# Patient Record
Sex: Female | Born: 1960 | Race: Black or African American | Hispanic: No | Marital: Married | Smoking: Never smoker
Health system: Southern US, Community
[De-identification: ages and names within clinical notes are randomized; demographics above are authoritative.]

## PROBLEM LIST (undated history)

## (undated) DIAGNOSIS — Z803 Family history of malignant neoplasm of breast: Secondary | ICD-10-CM

## (undated) DIAGNOSIS — G4730 Sleep apnea, unspecified: Secondary | ICD-10-CM

## (undated) DIAGNOSIS — I10 Essential (primary) hypertension: Secondary | ICD-10-CM

## (undated) HISTORY — PX: OTHER SURGICAL HISTORY: SHX169

## (undated) HISTORY — PX: MYOMECTOMY: SHX85

## (undated) HISTORY — DX: Family history of malignant neoplasm of breast: Z80.3

## (undated) HISTORY — PX: WISDOM TOOTH EXTRACTION: SHX21

## (undated) HISTORY — PX: CHOLECYSTECTOMY: SHX55

---

## 2018-08-03 ENCOUNTER — Emergency Department
Admission: EM | Admit: 2018-08-03 | Discharge: 2018-08-03 | Disposition: A | Discharge status: Home / Self Care | Payer: BLUE CROSS/BLUE SHIELD | Attending: Emergency Medicine | Admitting: Emergency Medicine

## 2018-08-03 ENCOUNTER — Emergency Department: Payer: BLUE CROSS/BLUE SHIELD

## 2018-08-03 ENCOUNTER — Other Ambulatory Visit: Payer: Self-pay

## 2018-08-03 DIAGNOSIS — R002 Palpitations: Secondary | ICD-10-CM | POA: Diagnosis present

## 2018-08-03 DIAGNOSIS — I493 Ventricular premature depolarization: Secondary | ICD-10-CM | POA: Insufficient documentation

## 2018-08-03 LAB — CBC
HEMATOCRIT: 39 % (ref 35.0–47.0)
HEMOGLOBIN: 13.6 g/dL (ref 12.0–16.0)
MCH: 29.9 pg (ref 26.0–34.0)
MCHC: 35 g/dL (ref 32.0–36.0)
MCV: 85.3 fL (ref 80.0–100.0)
Platelets: 262 10*3/uL (ref 150–440)
RBC: 4.57 MIL/uL (ref 3.80–5.20)
RDW: 14.9 % — ABNORMAL HIGH (ref 11.5–14.5)
WBC: 8.8 10*3/uL (ref 3.6–11.0)

## 2018-08-03 LAB — COMPREHENSIVE METABOLIC PANEL
ALK PHOS: 57 U/L (ref 38–126)
ALT: 16 U/L (ref 0–44)
ANION GAP: 11 (ref 5–15)
AST: 21 U/L (ref 15–41)
Albumin: 4.4 g/dL (ref 3.5–5.0)
BILIRUBIN TOTAL: 0.6 mg/dL (ref 0.3–1.2)
BUN: 15 mg/dL (ref 6–20)
CO2: 23 mmol/L (ref 22–32)
CREATININE: 0.99 mg/dL (ref 0.44–1.00)
Calcium: 9.3 mg/dL (ref 8.9–10.3)
Chloride: 104 mmol/L (ref 98–111)
GFR calc Af Amer: >60 mL/min (ref >60)
GFR calc non Af Amer: >60 mL/min (ref >60)
GLUCOSE: 107 mg/dL — AB (ref 70–99)
Potassium: 3.7 mmol/L (ref 3.5–5.1)
Sodium: 138 mmol/L (ref 135–145)
Total Protein: 8.1 g/dL (ref 6.5–8.1)

## 2018-08-03 LAB — MAGNESIUM: MAGNESIUM: 2.2 mg/dL (ref 1.7–2.4)

## 2018-08-03 LAB — TROPONIN I: Troponin I: <0.03 ng/mL (ref <0.03)

## 2018-08-03 NOTE — ED Triage Notes (Signed)
Pt in with co palpitations that woke her up tonight, no hx of the same.

## 2018-08-03 NOTE — ED Provider Notes (Signed)
[facility] Emergency Department Provider Note    First MD Initiated Contact with Patient 08/03/18 [phone]     (approximate)  I have reviewed the triage vital signs and the nursing notes.   HISTORY  Chief Complaint Palpitations    HPI Shelly Kaufman is a 57 y.o. female since emergency with acute onset of rapid heartbeat on awakening this morning. Patient denies any chest pain or shortness of breath no nausea or vomiting.   Past medical history hypertension There are no active problems to display for this patient.     Prior to Admission medications   Not on File    Allergies no known drug allergies No family history on file.  Social History Social History   Tobacco Use  . Smoking status: Not on file  Substance Use Topics  . Alcohol use: Not on file  . Drug use: Not on file    Review of Systems Constitutional: No fever/chills Eyes: No visual changes. ENT: No sore throat. Cardiovascular: Denies chest pain. positive for palpitations Respiratory: Denies shortness of breath. Gastrointestinal: No abdominal pain.  No nausea, no vomiting.  No diarrhea.  No constipation. Genitourinary: Negative for dysuria. Musculoskeletal: Negative for neck pain.  Negative for back pain. Integumentary: Negative for rash. Neurological: Negative for headaches, focal weakness or numbness.   ____________________________________________   PHYSICAL EXAM:  VITAL SIGNS: ED Triage Vitals  Enc Vitals Group     BP 08/03/18 0110 (!) 159/78     Pulse Rate 08/03/18 0110 78     Resp 08/03/18 0110 18     Temp 08/03/18 0110 98.3 F (36.8 C)     Temp Source 08/03/18 0110 Oral     SpO2 08/03/18 0110 100 %     Weight 08/03/18 0107 (!) 137.9 kg (304 lb)     Height 08/03/18 0107 1.727 m (5\' 8" )     Head Circumference --      Peak Flow --      Pain Score 08/03/18 0107 0     Pain Loc --      Pain Edu? --      Excl. in GC? --     Constitutional: Alert and  oriented. Well appearing and in no acute distress. Eyes: Conjunctivae are normal.  Mouth/Throat: Mucous membranes are moist.  Oropharynx non-erythematous. Neck: No stridor.   Cardiovascular: Normal rate, regular rhythm. Good peripheral circulation. Grossly normal heart sounds. Respiratory: Normal respiratory effort.  No retractions. Lungs CTAB. Gastrointestinal: Soft and nontender. No distention.  Musculoskeletal: No lower extremity tenderness nor edema. No gross deformities of extremities. Neurologic:  Normal speech and language. No gross focal neurologic deficits are appreciated.  Skin:  Skin is warm, dry and intact. No rash noted. Psychiatric: Mood and affect are normal. Speech and behavior are normal.  ____________________________________________   LABS (all labs ordered are listed, but only abnormal results are displayed)  Labs Reviewed  CBC - Abnormal; Notable for the following components:      Result Value   RDW 14.9 (*)    All other components within normal limits  COMPREHENSIVE METABOLIC PANEL - Abnormal; Notable for the following components:   Glucose, Bld 107 (*)    All other components within normal limits  TROPONIN I  MAGNESIUM   ____________________________________________  EKG  ED ECG REPORT I, [place] N BROWN, the attending physician, personally viewed and interpreted this ECG.   Date: 08/03/2018  EKG Time: 1:10AM  Rate: 88  Rhythm: Normal sinus rhythm  Axis: normal  Intervals:normal  ST&T Change: None  ____________________________________________  RADIOLOGY I, [place] N BROWN, personally viewed and evaluated these images (plain radiographs) as part of my medical decision making, as well as reviewing the written report by the radiologist.  ED MD interpretation:  normal chest x-ray per radiologist.  Official radiology report(s): Dg Chest 2 View  Result Date: 08/03/2018 CLINICAL DATA:  Palpitations EXAM: CHEST - 2 VIEW COMPARISON:  None. FINDINGS:  Lungs are clear.  No pleural effusion or pneumothorax. The heart is normal in size. Visualized osseous structures are within normal limits. IMPRESSION: Normal chest radiographs. Electronically Signed   By: Charline Bills M.D.   On: 08/03/2018 01:48    Procedures   ____________________________________________   INITIAL IMPRESSION / ASSESSMENT AND PLAN / ED COURSE  As part of my medical decision making, I reviewed the following data within the electronic MEDICAL RECORD NUMBER   57 year old female presenting with above stated history of physical exam secondary to palpitations that awoke the patient this morning. While evaluating the patient patient had isolated premature ventricular contractions noted on the cardiac monitor. When I informed the patient of this she states that this is what was occurring at home. Laboratory data unremarkable chest x-ray likewise negative. Patient be referred to Dr. Juliann Pares for further outpatient evaluation ____________________________________________  FINAL CLINICAL IMPRESSION(S) / ED DIAGNOSES  Final diagnoses:  PVC (premature ventricular contraction)     MEDICATIONS GIVEN DURING THIS VISIT:  Medications - No data to display   ED Discharge Orders    None       Note:  This document was prepared using Dragon voice recognition software and may include unintentional dictation errors.    Darci Current, MD 08/03/18 [phone]

## 2018-11-15 DIAGNOSIS — I10 Essential (primary) hypertension: Secondary | ICD-10-CM | POA: Insufficient documentation

## 2018-11-15 DIAGNOSIS — Z8679 Personal history of other diseases of the circulatory system: Secondary | ICD-10-CM | POA: Insufficient documentation

## 2018-11-15 DIAGNOSIS — Z780 Asymptomatic menopausal state: Secondary | ICD-10-CM | POA: Insufficient documentation

## 2018-11-30 ENCOUNTER — Other Ambulatory Visit: Payer: Self-pay | Admitting: Internal Medicine

## 2018-11-30 ENCOUNTER — Other Ambulatory Visit (HOSPITAL_COMMUNITY): Payer: Self-pay | Admitting: Internal Medicine

## 2018-11-30 DIAGNOSIS — R3121 Asymptomatic microscopic hematuria: Secondary | ICD-10-CM

## 2018-12-07 ENCOUNTER — Ambulatory Visit
Admission: RE | Admit: 2018-12-07 | Discharge: 2018-12-07 | Disposition: A | Discharge status: Home / Self Care | Payer: PRIVATE HEALTH INSURANCE | Source: Ambulatory Visit | Attending: Internal Medicine | Admitting: Internal Medicine

## 2018-12-07 DIAGNOSIS — R3121 Asymptomatic microscopic hematuria: Secondary | ICD-10-CM | POA: Insufficient documentation

## 2018-12-07 HISTORY — DX: Essential (primary) hypertension: I10

## 2018-12-07 MED ORDER — IOPAMIDOL (ISOVUE-370) INJECTION 76%
125.0000 mL | Freq: Once | INTRAVENOUS | Status: AC | PRN
  Administered 2018-12-07: 125 mL via INTRAVENOUS

## 2018-12-28 ENCOUNTER — Ambulatory Visit: Payer: Self-pay | Admitting: Urology

## 2018-12-28 NOTE — Progress Notes (Incomplete)
12/28/2018 8:17 AM   Shelly Kaufman 07-30-1961 [id number]  Referring provider: Leotis Shames, MD [address] [facility] [place], [place] [address]  No chief complaint on file.   HPI: Shelly Kaufman is a 58 yo F who presents today for the evaluation and management of asymptomatic microscopic hematuria. She was referred to [place] by Leotis Shames, MD.   On her previous visit with her PCP, her UA w/micro showed >3 RBC and a negative urine culture.   PMH: Past Medical History:  Diagnosis Date   Hypertension     Surgical History: *** The histories are not reviewed yet. Please review them in the "History" navigator section and refresh this SmartLink.  Home Medications:  Allergies as of 12/28/2018   Not on File     Medication List    as of December 28, 2018  8:17 AM   You have not been prescribed any medications.     Allergies: Not on File  Family History: No family history on file.  Social History:  has no history on file for tobacco, alcohol, and drug.  ROS:                                        Physical Exam: There were no vitals taken for this visit.  Constitutional:  Alert and oriented, No acute distress. HEENT: [place] AT, moist mucus membranes.  Trachea midline, no masses. Cardiovascular: No clubbing, cyanosis, or edema. Respiratory: Normal respiratory effort, no increased work of breathing. GI: Abdomen is soft, nontender, nondistended, no abdominal masses GU: No CVA tenderness Lymph: No cervical or inguinal lymphadenopathy. Skin: No rashes, bruises or suspicious lesions. Neurologic: Grossly intact, no focal deficits, moving all 4 extremities. Psychiatric: Normal mood and affect.  Laboratory Data: Lab Results  Component Value Date   WBC 8.8 08/03/2018   HGB 13.6 08/03/2018   HCT 39.0 08/03/2018   MCV 85.3 08/03/2018   PLT 262 08/03/2018    Lab Results  Component Value Date   CREATININE 0.99 08/03/2018     No results found for: PSA  No results found for: TESTOSTERONE  No results found for: HGBA1C  Urinalysis No results found for: COLORURINE, APPEARANCEUR, LABSPEC, PHURINE, GLUCOSEU, HGBUR, BILIRUBINUR, KETONESUR, PROTEINUR, UROBILINOGEN, NITRITE, LEUKOCYTESUR  No results found for: LABMICR, WBCUA, RBCUA, LABEPIT, MUCUS, BACTERIA  Pertinent Imaging: CLINICAL DATA:  Asymptomatic microscopic hematuria  EXAM: CT ABDOMEN AND PELVIS WITHOUT AND WITH CONTRAST  TECHNIQUE: Multidetector CT imaging of the abdomen and pelvis was performed following the standard protocol before and following the bolus administration of intravenous contrast.  CONTRAST:  ISOVUE-370 IOPAMIDOL (ISOVUE-370) INJECTION 76%  COMPARISON:  None.  FINDINGS: Lower chest: Unremarkable  Hepatobiliary: Cholecystectomy.  Otherwise unremarkable.  Pancreas: Unremarkable  Spleen: Unremarkable  Adrenals/Urinary Tract: Both adrenal glands appear normal. Small left peripelvic renal cysts. No urinary tract calculi, abnormal renal parenchymal enhancement, or abnormal urographic phase filling defect along the urothelium to explain the patient's hematuria.  Stomach/Bowel: Unremarkable  Vascular/Lymphatic: Unremarkable  Reproductive: Unremarkable  Other: No supplemental non-categorized findings.  Musculoskeletal: Lower lumbar degenerative facet arthropathy.  IMPRESSION: 1. No cause for hematuria is identified. 2. Lower lumbar degenerative facet arthropathy.   Electronically Signed   By: Gaylyn Rong M.D.   On: 12/07/2018 11:23   I have personally reviewed the images and agree with radiologist interpretation.    Assessment & Plan:  1. Microscopic Hematuria Her CT from 12/07/2018 showed no cause for hematuria.   No follow-ups on file.  [facility] [address], [address] [place], [place] [address] [phone]  I,  Donne Hazel, am acting as a scribe for Dr. Vanna Scotland,  {Add Scribe Attestation Statement}

## 2019-01-10 ENCOUNTER — Ambulatory Visit (INDEPENDENT_AMBULATORY_CARE_PROVIDER_SITE_OTHER): Payer: PRIVATE HEALTH INSURANCE | Admitting: Urology

## 2019-01-10 ENCOUNTER — Encounter: Payer: Self-pay | Admitting: Urology

## 2019-01-10 VITALS — BP 141/84 | HR 53 | Ht 68.0 in | Wt 308.0 lb

## 2019-01-10 DIAGNOSIS — R3121 Asymptomatic microscopic hematuria: Secondary | ICD-10-CM | POA: Diagnosis not present

## 2019-01-10 NOTE — Progress Notes (Signed)
01/10/2019  4:05 PM   Roderick Pee Barry-Black October 09, 1961 [id number]  Referring provider: Leotis Shames, MD [address] [facility] [place], [place] [address]  No chief complaint on file.   HPI: Shelly Kaufman is a 58 yo F who presents today for the evaluation and management of asymptomatic microscopic hematuria. He was referred to [place] by Leotis Shames, MD.   Her UA at the time of visit with PCP was positive for >3 RBCs. Her urine culture was negative.   Her most recent CT scan was unremarkable with no pathology. She denies gross hematuria.  She has no FHx of urinary cancers.   She is not a smoker. She is not menopausal. Her uterus is still present.   Her UA shows 3-10 RBCs and many bacteria, otherwise unremarkable.   PMH: Past Medical History:  Diagnosis Date  . Hypertension     Surgical History: History reviewed. No pertinent surgical history.  Home Medications:  Allergies as of 01/10/2019   Not on File     Medication List       Accurate as of January 10, 2019  4:05 PM. Always use your most recent med list.        lisinopril 5 MG tablet Commonly known as:  PRINIVIL,ZESTRIL Take by mouth.   metoprolol succinate 25 MG 24 hr tablet Commonly known as:  TOPROL-XL Take by mouth.   Vitamin D3 25 MCG (1000 UT) Caps Take by mouth.       Allergies: Not on File  Family History: History reviewed. No pertinent family history.  Social History:  reports that she has never smoked. She has never used smokeless tobacco. She reports current alcohol use. No history on file for drug.  ROS: UROLOGY Frequent Urination?: No Hard to postpone urination?: No Burning/pain with urination?: No Get up at night to urinate?: Yes Leakage of urine?: Yes Urine stream starts and stops?: No Trouble starting stream?: No Do you have to strain to urinate?: No Blood in urine?: No Urinary tract infection?: No Sexually transmitted disease?: No Injury to kidneys or  bladder?: No Painful intercourse?: No Weak stream?: No Currently pregnant?: No Vaginal bleeding?: No Last menstrual period?: n  Gastrointestinal Nausea?: No Vomiting?: No Indigestion/heartburn?: No Diarrhea?: No Constipation?: No  Constitutional Fever: No Night sweats?: Yes Weight loss?: No Fatigue?: No  Skin Skin rash/lesions?: No Itching?: No  Eyes Blurred vision?: No Double vision?: No  Ears/Nose/Throat Sore throat?: No Sinus problems?: No  Hematologic/Lymphatic Swollen glands?: No Easy bruising?: No  Cardiovascular Leg swelling?: No Chest pain?: No  Respiratory Cough?: No Shortness of breath?: No  Endocrine Excessive thirst?: No  Musculoskeletal Back pain?: No Joint pain?: No  Neurological Headaches?: No Dizziness?: No  Psychologic Depression?: No Anxiety?: No  Physical Exam: BP (!) 141/84   Pulse (!) 53   Ht 5\' 8"  (1.727 m)   Wt (!) 308 lb (139.7 kg)   BMI 46.83 kg/m   Constitutional:  Alert and oriented, No acute distress. HEENT: [place] AT, moist mucus membranes.  Trachea midline, no masses. Cardiovascular: No clubbing, cyanosis, or edema. Respiratory: Normal respiratory effort, no increased work of breathing. Abdomen: Morbidly obese Skin: No rashes, bruises or suspicious lesions. Neurologic: Grossly intact, no focal deficits, moving all 4 extremities. Psychiatric: Normal mood and affect.  Laboratory Data:  Urinalysis  Her UA is positive for 3-10 RBCs.   Pertinent Imaging: CLINICAL DATA:  Asymptomatic microscopic hematuria  EXAM: CT ABDOMEN AND PELVIS WITHOUT AND WITH CONTRAST  TECHNIQUE:  Multidetector CT imaging of the abdomen and pelvis was performed following the standard protocol before and following the bolus administration of intravenous contrast.  CONTRAST:  ISOVUE-370 IOPAMIDOL (ISOVUE-370) INJECTION 76%  COMPARISON:  None.  FINDINGS: Lower chest: Unremarkable  Hepatobiliary: Cholecystectomy.   Otherwise unremarkable.  Pancreas: Unremarkable  Spleen: Unremarkable  Adrenals/Urinary Tract: Both adrenal glands appear normal. Small left peripelvic renal cysts. No urinary tract calculi, abnormal renal parenchymal enhancement, or abnormal urographic phase filling defect along the urothelium to explain the patient's hematuria.  Stomach/Bowel: Unremarkable  Vascular/Lymphatic: Unremarkable  Reproductive: Unremarkable  Other: No supplemental non-categorized findings.  Musculoskeletal: Lower lumbar degenerative facet arthropathy.  IMPRESSION: 1. No cause for hematuria is identified. 2. Lower lumbar degenerative facet arthropathy.  Electronically Signed   By: Gaylyn Rong M.D.   On: 12/07/2018 11:23  I have personally reviewed the images and agree with radiologist interpretation.   Assessment & Plan:    1. Asymptomatic microscopic hematuria  We discussed the differential diagnosis for microscopic hematuria including nephrolithiasis, renal or upper tract tumors, bladder stones, UTIs, or bladder tumors as well as undetermined etiologies. Per AUA guidelines, I did recommend complete microscopic hematuria evaluation including CTU, possible urine cytology, and office cystoscopy. CTU was ordered by PCP and showed NED for hematuria.  I have explained to the patient that they will  be scheduled for a cystoscopy in our office to evaluate their bladder.  The cystoscopy consists of passing a tube with a lens up through their urethra and into their urinary bladder.   We will inject the urethra with a lidocaine gel prior to introducing the cystoscope to help with any discomfort during the procedure.   After the procedure, they might experience blood in the urine and discomfort with urination.  This will abate after the first few voids.  I have  encouraged the patient to increase water intake  during this time. F/u for next available cystoscopy   Return for next avaialbe  cyto .  [facility] [address], [address] [place], [place] [address] [phone]  I, Donne Hazel, am acting as a scribe for Dr. Vanna Scotland,  I have reviewed the above documentation for accuracy and completeness, and I agree with the above.   Vanna Scotland, MD

## 2019-01-10 NOTE — Patient Instructions (Signed)
Cystoscopy    Cystoscopy is a procedure that is used to help diagnose and sometimes treat conditions that affect that lower urinary tract. The lower urinary tract includes the bladder and the tube that drains urine from the bladder out of the body (urethra). Cystoscopy is performed with a thin, tube-shaped instrument with a light and camera at the end (cystoscope). The cystoscope may be hard (rigid) or flexible, depending on the goal of the procedure.The cystoscope is inserted through the urethra, into the bladder.  Cystoscopy may be recommended if you have:   Urinary tractinfections that keep coming back (recurring).   Blood in the urine (hematuria).   Loss of bladder control (urinary incontinence) or an overactive bladder.   Unusual cells found in a urine sample.   A blockage in the urethra.   Painful urination.   An abnormality in the bladder found during an intravenous pyelogram (IVP) or CT scan.  Cystoscopy may also be done to remove a sample of tissue to be examined under a microscope (biopsy).  Tell a health care provider about:   Any allergies you have.   All medicines you are taking, including vitamins, herbs, eye drops, creams, and over-the-counter medicines.   Any problems you or family members have had with anesthetic medicines.   Any blood disorders you have.   Any surgeries you have had.   Any medical conditions you have.   Whether you are pregnant or may be pregnant.  What are the risks?  Generally, this is a safe procedure. However, problems may occur, including:   Infection.   Bleeding.   Allergic reactions to medicines.   Damage to other structures or organs.  What happens before the procedure?   Ask your health care provider about:  ? Changing or stopping your regular medicines. This is especially important if you are taking diabetes medicines or blood thinners.  ? Taking medicines such as aspirin and ibuprofen. These medicines can thin your blood. Do not take these medicines  before your procedure if your health care provider instructs you not to.   Follow instructions from your health care provider about eating or drinking restrictions.   You may be given antibiotic medicine to help prevent infection.   You may have an exam or testing, such as X-rays of the bladder, urethra, or kidneys.   You may have urine tests to check for signs of infection.   Plan to have someone take you home after the procedure.  What happens during the procedure?   To reduce your risk of infection,your health care team will wash or sanitize their hands.   You will be given one or more of the following:  ? A medicine to help you relax (sedative).  ? A medicine to numb the area (local anesthetic).   The area around the opening of your urethra will be cleaned.   The cystoscope will be passed through your urethra into your bladder.   Germ-free (sterile)fluid will flow through the cystoscope to fill your bladder. The fluid will stretch your bladder so that your surgeon can clearly examine your bladder walls.   The cystoscope will be removed and your bladder will be emptied.  The procedure may vary among health care providers and hospitals.  What happens after the procedure?   You may have some soreness or pain in your abdomen and urethra. Medicines will be available to help you.   You may have some blood in your urine.   Do not drive for   24 hours if you received a sedative.  This information is not intended to replace advice given to you by your health care provider. Make sure you discuss any questions you have with your health care provider.  Document Released: 10/16/2000 Document Revised: 07/30/2017 Document Reviewed: 09/05/2015  Elsevier Interactive Patient Education  2019 [employer]

## 2019-01-11 LAB — URINALYSIS, COMPLETE
BILIRUBIN UA: NEGATIVE
Glucose, UA: NEGATIVE
KETONES UA: NEGATIVE
Leukocytes, UA: NEGATIVE
NITRITE UA: NEGATIVE
PH UA: 5.5 (ref 5.0–7.5)
Protein, UA: NEGATIVE
Specific Gravity, UA: 1.025 (ref 1.005–1.030)
UUROB: 0.2 mg/dL (ref 0.2–1.0)

## 2019-01-11 LAB — MICROSCOPIC EXAMINATION

## 2019-02-21 ENCOUNTER — Other Ambulatory Visit: Payer: PRIVATE HEALTH INSURANCE | Admitting: Urology

## 2019-04-25 ENCOUNTER — Other Ambulatory Visit: Payer: Self-pay

## 2019-04-25 ENCOUNTER — Encounter: Payer: Self-pay | Admitting: Urology

## 2019-04-25 ENCOUNTER — Ambulatory Visit (INDEPENDENT_AMBULATORY_CARE_PROVIDER_SITE_OTHER): Payer: PRIVATE HEALTH INSURANCE | Admitting: Urology

## 2019-04-25 VITALS — BP 138/82 | HR 78 | Ht 68.0 in | Wt 308.0 lb

## 2019-04-25 DIAGNOSIS — R3121 Asymptomatic microscopic hematuria: Secondary | ICD-10-CM | POA: Diagnosis not present

## 2019-04-25 LAB — URINALYSIS, COMPLETE
Bilirubin, UA: NEGATIVE
Glucose, UA: NEGATIVE
Ketones, UA: NEGATIVE
Leukocytes,UA: NEGATIVE
Nitrite, UA: NEGATIVE
Protein,UA: NEGATIVE
Specific Gravity, UA: 1.025 (ref 1.005–1.030)
Urobilinogen, Ur: 0.2 mg/dL (ref 0.2–1.0)
pH, UA: 5 (ref 5.0–7.5)

## 2019-04-25 LAB — MICROSCOPIC EXAMINATION

## 2019-04-25 NOTE — Progress Notes (Signed)
   04/25/19  CC:  Chief Complaint  Patient presents with  . Cysto    HPI: 58 year old female with asymptomatic microscopic hematuria who presents today for cystoscopy to complete her evaluation.  She is previously had upper tract imaging in the form of CT urogram which was unremarkable.  There were no vitals taken for this visit. NED. A&Ox3.   No respiratory distress   Abd soft, NT, ND Normal external genitalia with patent urethral meatus  Cystoscopy Procedure Note  Patient identification was confirmed, informed consent was obtained, and patient was prepped using Betadine solution.  Lidocaine jelly was administered per urethral meatus.    Procedure: - Flexible cystoscope introduced, without any difficulty.   - Thorough search of the bladder revealed:    normal urethral meatus    normal urothelium    no stones    no ulcers     no tumors    no urethral polyps    no trabeculation  - Ureteral orifices were normal in position and appearance.  Post-Procedure: - Patient tolerated the procedure well  Assessment/ Plan:  1. Asymptomatic microscopic hematuria Cystoscopy today is unremarkable Status post negative hematuria work-up I have advised the patient that she should follow-up in 2 to 3 years if her microscopic hematuria persist She is agreeable this plan - Urinalysis, Complete    Return if symptoms worsen or fail to improve.  Hollice Espy, MD

## 2019-11-14 ENCOUNTER — Other Ambulatory Visit: Payer: Self-pay | Admitting: Infectious Diseases

## 2019-11-14 DIAGNOSIS — Z1231 Encounter for screening mammogram for malignant neoplasm of breast: Secondary | ICD-10-CM

## 2019-11-20 DIAGNOSIS — Z6841 Body Mass Index (BMI) 40.0 and over, adult: Secondary | ICD-10-CM | POA: Insufficient documentation

## 2019-12-06 ENCOUNTER — Ambulatory Visit
Admission: RE | Admit: 2019-12-06 | Discharge: 2019-12-06 | Disposition: A | Discharge status: Home / Self Care | Payer: BC Managed Care – PPO | Source: Ambulatory Visit | Attending: Infectious Diseases | Admitting: Infectious Diseases

## 2019-12-06 DIAGNOSIS — Z1231 Encounter for screening mammogram for malignant neoplasm of breast: Secondary | ICD-10-CM | POA: Insufficient documentation

## 2019-12-25 ENCOUNTER — Other Ambulatory Visit: Payer: Self-pay | Admitting: Infectious Diseases

## 2019-12-25 DIAGNOSIS — R928 Other abnormal and inconclusive findings on diagnostic imaging of breast: Secondary | ICD-10-CM

## 2019-12-29 ENCOUNTER — Ambulatory Visit
Admission: RE | Admit: 2019-12-29 | Discharge: 2019-12-29 | Disposition: A | Discharge status: Home / Self Care | Payer: BC Managed Care – PPO | Source: Ambulatory Visit | Attending: Infectious Diseases | Admitting: Infectious Diseases

## 2019-12-29 DIAGNOSIS — R928 Other abnormal and inconclusive findings on diagnostic imaging of breast: Secondary | ICD-10-CM

## 2020-01-01 ENCOUNTER — Other Ambulatory Visit: Payer: Self-pay | Admitting: Infectious Diseases

## 2020-01-01 DIAGNOSIS — N6489 Other specified disorders of breast: Secondary | ICD-10-CM

## 2020-05-17 ENCOUNTER — Other Ambulatory Visit (HOSPITAL_COMMUNITY)
Admission: RE | Admit: 2020-05-17 | Discharge: 2020-05-17 | Disposition: A | Discharge status: Home / Self Care | Payer: BC Managed Care – PPO | Source: Ambulatory Visit | Attending: Obstetrics and Gynecology | Admitting: Obstetrics and Gynecology

## 2020-05-17 ENCOUNTER — Ambulatory Visit (INDEPENDENT_AMBULATORY_CARE_PROVIDER_SITE_OTHER): Payer: BC Managed Care – PPO | Admitting: Obstetrics and Gynecology

## 2020-05-17 ENCOUNTER — Other Ambulatory Visit: Payer: Self-pay

## 2020-05-17 ENCOUNTER — Encounter: Payer: Self-pay | Admitting: Obstetrics and Gynecology

## 2020-05-17 VITALS — BP 132/78 | HR 79 | Resp 20 | Ht 68.0 in | Wt 303.2 lb

## 2020-05-17 DIAGNOSIS — Z0000 Encounter for general adult medical examination without abnormal findings: Secondary | ICD-10-CM

## 2020-05-17 DIAGNOSIS — Z124 Encounter for screening for malignant neoplasm of cervix: Secondary | ICD-10-CM

## 2020-05-17 DIAGNOSIS — Z01419 Encounter for gynecological examination (general) (routine) without abnormal findings: Secondary | ICD-10-CM

## 2020-05-17 DIAGNOSIS — E041 Nontoxic single thyroid nodule: Secondary | ICD-10-CM

## 2020-05-17 DIAGNOSIS — Z1329 Encounter for screening for other suspected endocrine disorder: Secondary | ICD-10-CM

## 2020-05-17 DIAGNOSIS — Z1239 Encounter for other screening for malignant neoplasm of breast: Secondary | ICD-10-CM

## 2020-05-17 NOTE — Progress Notes (Signed)
Gynecology Annual Exam  PCP: Mick Sell, MD  Chief Complaint:  Chief Complaint  Patient presents with  . Gynecologic Exam    History of Present Illness: Patient is a 59 y.o. No obstetric history on file. presents for annual exam. The patient has no complaints today.   LMP: No LMP recorded. Patient is postmenopausal. Review of Systems: ROS  Past Medical History:  There are no problems to display for this patient.   Past Surgical History:  No past surgical history on file.  Gynecologic History:  No LMP recorded. Patient is postmenopausal. Obstetric History: No obstetric history on file.  Family History:  Family History  Problem Relation Age of Onset  . Breast cancer Maternal Aunt 66    Social History:  Social History   Socioeconomic History  . Marital status: Married    Spouse name: Not on file  . Number of children: Not on file  . Years of education: Not on file  . Highest education level: Not on file  Occupational History  . Not on file  Tobacco Use  . Smoking status: Never Smoker  . Smokeless tobacco: Never Used  Substance and Sexual Activity  . Alcohol use: Yes  . Drug use: Not on file  . Sexual activity: Not on file  Other Topics Concern  . Not on file  Social History Narrative  . Not on file   Social Determinants of Health   Financial Resource Strain:   . Difficulty of Paying Living Expenses:   Food Insecurity:   . Worried About Programme researcher, broadcasting/film/video in the Last Year:   . Barista in the Last Year:   Transportation Needs:   . Freight forwarder (Medical):   Marland Kitchen Lack of Transportation (Non-Medical):   Physical Activity:   . Days of Exercise per Week:   . Minutes of Exercise per Session:   Stress:   . Feeling of Stress :   Social Connections:   . Frequency of Communication with Friends and Family:   . Frequency of Social Gatherings with Friends and Family:   . Attends Religious Services:   . Active Member of Clubs or  Organizations:   . Attends Banker Meetings:   Marland Kitchen Marital Status:   Intimate Partner Violence:   . Fear of Current or Ex-Partner:   . Emotionally Abused:   Marland Kitchen Physically Abused:   . Sexually Abused:     Allergies:  No Known Allergies  Medications: Prior to Admission medications   Medication Sig Start Date End Date Taking? Authorizing Provider  Cholecalciferol (VITAMIN D3) 25 MCG (1000 UT) CAPS Take by mouth.    [provider]  lisinopril (PRINIVIL,ZESTRIL) 5 MG tablet Take by mouth.    [provider]  metoprolol succinate (TOPROL-XL) 25 MG 24 hr tablet Take by mouth. 08/22/18 08/22/19  [provider]    Physical Exam Vitals: Blood pressure 132/78, pulse 79, resp. rate 20, height 5\' 8"  (1.727 m), weight (!) 303 lb 3.2 oz (137.5 kg), SpO2 98 %.  General: NAD HEENT: normocephalic, anicteric Thyroid: no enlargement, no palpable nodules Pulmonary: No increased work of breathing, CTAB Cardiovascular: RRR, distal pulses 2+ Breast: Breast symmetrical, no tenderness, no palpable nodules or masses, no skin or nipple retraction present, no nipple discharge.  No axillary or supraclavicular lymphadenopathy. Abdomen: NABS, soft, non-tender, non-distended.  Umbilicus without lesions.  No hepatomegaly, splenomegaly or masses palpable. No evidence of hernia  Genitourinary:  External: Normal external  female genitalia.  Normal urethral meatus, normal Bartholin's and Skene's glands.    Vagina: Normal vaginal mucosa, no evidence of prolapse.    Cervix: Grossly normal in appearance, no bleeding  Uterus: Non-enlarged, mobile, normal contour.  No CMT  Adnexa: ovaries non-enlarged, no adnexal masses  Rectal: deferred  Lymphatic: no evidence of inguinal lymphadenopathy Extremities: no edema, erythema, or tenderness Neurologic: Grossly intact Psychiatric: mood appropriate, affect full  Female chaperone present for pelvic and breast  portions of the physical  exam    Assessment: 59 y.o. No obstetric history on file. routine annual exam  Plan: Problem List Items Addressed This Visit    None    Visit Diagnoses    Screening for thyroid disorder    -  Primary   Relevant Orders   TSH + free T4   T3, free   Nodular thyroid disease       Relevant Orders   TSH + free T4   T3, free   US THYROID   Health maintenance examination       Encounter for annual routine gynecological examination       Encounter for screening breast examination       Encounter for gynecological examination without abnormal finding       Cervical cancer screening       Relevant Orders   Cytology - PAP      1) Mammogram - recommend yearly screening mammogram.  Mammogram Is up to date  2) STI screening  was offered and declined  3) ASCCP guidelines and rational discussed.  Patient opts for every 5 years screening interval  4)  Colonoscopy  Encouraged  5) Routine healthcare maintenance including cholesterol, diabetes screening discussed managed by PCP  6)Nodular thyroid- recommended [place] and lab tests  8) No follow-ups on file.   Natale Milch [facility], [facility] 05/17/2020, 10:51 AM

## 2020-05-17 NOTE — Patient Instructions (Signed)
Institute of Medicine Recommended Dietary Allowances for Calcium and Vitamin D  Age (yr) Calcium Recommended Dietary Allowance (mg/day) Vitamin D Recommended Dietary Allowance (international units/day)  9-18 1,300 600  19-50 1,000 600  51-70 1,200 600  71 and older 1,200 800  Data from Institute of Medicine. Dietary reference intakes: calcium, vitamin D. [place], [place]: National Academies Press; 2011.     Exercising to Stay Healthy To become healthy and stay healthy, it is recommended that you do moderate-intensity and vigorous-intensity exercise. You can tell that you are exercising at a moderate intensity if your heart starts beating faster and you start breathing faster but can still hold a conversation. You can tell that you are exercising at a vigorous intensity if you are breathing much harder and faster and cannot hold a conversation while exercising. Exercising regularly is important. It has many health benefits, such as:  Improving overall fitness, flexibility, and endurance.  Increasing bone density.  Helping with weight control.  Decreasing body fat.  Increasing muscle strength.  Reducing stress and tension.  Improving overall health. How often should I exercise? Choose an activity that you enjoy, and set realistic goals. Your health care provider can help you make an activity plan that works for you. Exercise regularly as told by your health care provider. This may include:  Doing strength training two times a week, such as: ? Lifting weights. ? Using resistance bands. ? Push-ups. ? Sit-ups. ? Yoga.  Doing a certain intensity of exercise for a given amount of time. Choose from these options: ? A total of 150 minutes of moderate-intensity exercise every week. ? A total of 75 minutes of vigorous-intensity exercise every week. ? A mix of moderate-intensity and vigorous-intensity exercise every week. Children, pregnant women, people who have not exercised  regularly, people who are overweight, and older adults may need to talk with a health care provider about what activities are safe to do. If you have a medical condition, be sure to talk with your health care provider before you start a new exercise program. What are some exercise ideas? Moderate-intensity exercise ideas include:  Walking 1 mile (1.6 km) in about 15 minutes.  Biking.  Hiking.  Golfing.  Dancing.  Water aerobics. Vigorous-intensity exercise ideas include:  Walking 4.5 miles (7.2 km) or more in about 1 hour.  Jogging or running 5 miles (8 km) in about 1 hour.  Biking 10 miles (16.1 km) or more in about 1 hour.  Lap swimming.  Roller-skating or in-line skating.  Cross-country skiing.  Vigorous competitive sports, such as football, basketball, and soccer.  Jumping rope.  Aerobic dancing. What are some everyday activities that can help me to get exercise?  Yard work, such as: ? Pushing a lawn mower. ? Raking and bagging leaves.  Washing your car.  Pushing a stroller.  Shoveling snow.  Gardening.  Washing windows or floors. How can I be more active in my day-to-day activities?  Use stairs instead of an elevator.  Take a walk during your lunch break.  If you drive, park your car farther away from your work or school.  If you take public transportation, get off one stop early and walk the rest of the way.  Stand up or walk around during all of your indoor phone calls.  Get up, stretch, and walk around every 30 minutes throughout the day.  Enjoy exercise with a friend. Support to continue exercising will help you keep a regular routine of activity. What guidelines can   I follow while exercising?  Before you start a new exercise program, talk with your health care provider.  Do not exercise so much that you hurt yourself, feel dizzy, or get very short of breath.  Wear comfortable clothes and wear shoes with good support.  Drink plenty of  water while you exercise to prevent dehydration or heat stroke.  Work out until your breathing and your heartbeat get faster. Where to find more information  U.S. Department of Health and Human Services: www.hhs.gov  Centers for Disease Control and Prevention (CDC): www.cdc.gov Summary  Exercising regularly is important. It will improve your overall fitness, flexibility, and endurance.  Regular exercise also will improve your overall health. It can help you control your weight, reduce stress, and improve your bone density.  Do not exercise so much that you hurt yourself, feel dizzy, or get very short of breath.  Before you start a new exercise program, talk with your health care provider. This information is not intended to replace advice given to you by your health care provider. Make sure you discuss any questions you have with your health care provider. Document Revised: 10/01/2017 Document Reviewed: 09/09/2017 Elsevier Patient Education  2020 Elsevier Inc.   Budget-Friendly Healthy Eating There are many ways to save money at the grocery store and continue to eat healthy. You can be successful if you:  Plan meals according to your budget.  Make a grocery list and only purchase food according to your grocery list.  Prepare food yourself. What are tips for following this plan?  Reading food labels  Compare food labels between brand name foods and the store brand. Often the nutritional value is the same, but the store brand is lower cost.  Look for products that do not have added sugar, fat, or salt (sodium). These often cost the same but are healthier for you. Products may be labeled as: ? Sugar-free. ? Nonfat. ? Low-fat. ? Sodium-free. ? Low-sodium.  Look for lean ground beef labeled as at least 92% lean and 8% fat. Shopping  Buy only the items on your grocery list and go only to the areas of the store that have the items on your list.  Use coupons only for foods  and brands you normally buy. Avoid buying items you wouldn't normally buy simply because they are on sale.  Check online and in newspapers for weekly deals.  Buy healthy items from the bulk bins when available, such as herbs, spices, flour, pasta, nuts, and dried fruit.  Buy fruits and vegetables that are in season. Prices are usually lower on in-season produce.  Look at the unit price on the price tag. Use it to compare different brands and sizes to find out which item is the best deal.  Choose healthy items that are often low-cost, such as carrots, potatoes, apples, bananas, and oranges. Dried or canned beans are a low-cost protein source.  Buy in bulk and freeze extra food. Items you can buy in bulk include meats, fish, poultry, frozen fruits, and frozen vegetables.  Avoid buying "ready-to-eat" foods, such as pre-cut fruits and vegetables and pre-made salads.  If possible, shop around to discover where you can find the best prices. Consider other retailers such as dollar stores, larger wholesale stores, local fruit and vegetable stands, and farmers markets.  Do not shop when you are hungry. If you shop while hungry, it may be hard to stick to your list and budget.  Resist impulse buying. Use your grocery list as   your official plan for the week.  Buy a variety of vegetables and fruits by purchasing fresh, frozen, and canned items.  Look at the top and bottom shelves for deals. Foods at eye level (eye level of an adult or child) are usually more expensive.  Be efficient with your time when shopping. The more time you spend at the store, the more money you are likely to spend.  To save money when choosing more expensive foods like meats and dairy: ? Choose cheaper cuts of meat, such as bone-in chicken thighs and drumsticks instead of skinless and boneless chicken. When you are ready to prepare the chicken, you can remove the skin yourself to make it healthier. ? Choose lean meats like  chicken or turkey instead of beef. ? Choose canned seafood, such as tuna, salmon, or sardines. ? Buy eggs as a low-cost source of protein. ? Buy dried beans and peas, such as lentils, split peas, or kidney beans instead of meats. Dried beans and peas are a good alternative source of protein. ? Buy the larger tubs of yogurt instead of individual-sized containers.  Choose water instead of sodas and other sweetened beverages.  Avoid buying chips, cookies, and other "junk food." These items are usually expensive and not healthy. Cooking  Make extra food and freeze the extras in meal-sized containers or in individual portions for fast meals and snacks.  Pre-cook on days when you have extra time to prepare meals in advance. You can keep these meals in the fridge or freezer and reheat for a quick meal.  When you come home from the grocery store, wash, peel, and cut fruits and vegetables so they are ready to use and eat. This will help reduce food waste. Meal planning  Do not eat out or get fast food. Prepare food at home.  Make a grocery list and make sure to bring it with you to the store. If you have a smart phone, you could use your phone to create your shopping list.  Plan meals and snacks according to a grocery list and budget you create.  Use leftovers in your meal plan for the week.  Look for recipes where you can cook once and make enough food for two meals.  Include budget-friendly meals like stews, casseroles, and stir-fry dishes.  Try some meatless meals or try "no cook" meals like salads.  Make sure that half your plate is filled with fruits or vegetables. Choose from fresh, frozen, or canned fruits and vegetables. If eating canned, remember to rinse them before eating. This will remove any excess salt added for packaging. Summary  Eating healthy on a budget is possible if you plan your meals according to your budget, purchase according to your budget and grocery list, and  prepare food yourself.  Tips for buying more food on a limited budget include buying generic brands, using coupons only for foods you normally buy, and buying healthy items from the bulk bins when available.  Tips for buying cheaper food to replace expensive food include choosing cheaper, lean cuts of meat, and buying dried beans and peas. This information is not intended to replace advice given to you by your health care provider. Make sure you discuss any questions you have with your health care provider. Document Revised: 10/20/2017 Document Reviewed: 10/20/2017 Elsevier Patient Education  2020 Elsevier Inc.   Bone Health Bones protect organs, store calcium, anchor muscles, and support the whole body. Keeping your bones strong is important, especially as you   get older. You can take actions to help keep your bones strong and healthy. Why is keeping my bones healthy important?  Keeping your bones healthy is important because your body constantly replaces bone cells. Cells get old, and new cells take their place. As we age, we lose bone cells because the body may not be able to make enough new cells to replace the old cells. The amount of bone cells and bone tissue you have is referred to as bone mass. The higher your bone mass, the stronger your bones. The aging process leads to an overall loss of bone mass in the body, which can increase the likelihood of:  Joint pain and stiffness.  Broken bones.  A condition in which the bones become weak and brittle (osteoporosis). A large decline in bone mass occurs in older adults. In women, it occurs about the time of menopause. What actions can I take to keep my bones healthy? Good health habits are important for maintaining healthy bones. This includes eating nutritious foods and exercising regularly. To have healthy bones, you need to get enough of the right minerals and vitamins. Most nutrition experts recommend getting these nutrients from the  foods that you eat. In some cases, taking supplements may also be recommended. Doing certain types of exercise is also important for bone health. What are the nutritional recommendations for healthy bones?  Eating a well-balanced diet with plenty of calcium and vitamin D will help to protect your bones. Nutritional recommendations vary from person to person. Ask your health care provider what is healthy for you. Here are some general guidelines. Get enough calcium Calcium is the most important (essential) mineral for bone health. Most people can get enough calcium from their diet, but supplements may be recommended for people who are at risk for osteoporosis. Good sources of calcium include:  Dairy products, such as low-fat or nonfat milk, cheese, and yogurt.  Dark green leafy vegetables, such as bok choy and broccoli.  Calcium-fortified foods, such as orange juice, cereal, bread, soy beverages, and tofu products.  Nuts, such as almonds. Follow these recommended amounts for daily calcium intake:  Children, age 1-3: 700 mg.  Children, age 4-8: 1,000 mg.  Children, age 9-13: 1,300 mg.  Teens, age 14-18: 1,300 mg.  Adults, age 19-50: 1,000 mg.  Adults, age 51-70: ? Men: 1,000 mg. ? Women: 1,200 mg.  Adults, age 71 or older: 1,200 mg.  Pregnant and breastfeeding females: ? Teens: 1,300 mg. ? Adults: 1,000 mg. Get enough vitamin D Vitamin D is the most essential vitamin for bone health. It helps the body absorb calcium. Sunlight stimulates the skin to make vitamin D, so be sure to get enough sunlight. If you live in a cold climate or you do not get outside often, your health care provider may recommend that you take vitamin D supplements. Good sources of vitamin D in your diet include:  Egg yolks.  Saltwater fish.  Milk and cereal fortified with vitamin D. Follow these recommended amounts for daily vitamin D intake:  Children and teens, age 1-18: 600 international  units.  Adults, age 50 or younger: 400-800 international units.  Adults, age 51 or older: 800-1,000 international units. Get other important nutrients Other nutrients that are important for bone health include:  Phosphorus. This mineral is found in meat, poultry, dairy foods, nuts, and legumes. The recommended daily intake for adult men and adult women is 700 mg.  Magnesium. This mineral is found in seeds, nuts, dark   green vegetables, and legumes. The recommended daily intake for adult men is 400-420 mg. For adult women, it is 310-320 mg.  Vitamin K. This vitamin is found in green leafy vegetables. The recommended daily intake is 120 mg for adult men and 90 mg for adult women. What type of physical activity is best for building and maintaining healthy bones? Weight-bearing and strength-building activities are important for building and maintaining healthy bones. Weight-bearing activities cause muscles and bones to work against gravity. Strength-building activities increase the strength of the muscles that support bones. Weight-bearing and muscle-building activities include:  Walking and hiking.  Jogging and running.  Dancing.  Gym exercises.  Lifting weights.  Tennis and racquetball.  Climbing stairs.  Aerobics. Adults should get at least 30 minutes of moderate physical activity on most days. Children should get at least 60 minutes of moderate physical activity on most days. Ask your health care provider what type of exercise is best for you. How can I find out if my bone mass is low? Bone mass can be measured with an X-ray test called a bone mineral density (BMD) test. This test is recommended for all women who are age 65 or older. It may also be recommended for:  Men who are age 70 or older.  People who are at risk for osteoporosis because of: ? Having bones that break easily. ? Having a long-term disease that weakens bones, such as kidney disease or rheumatoid  arthritis. ? Having menopause earlier than normal. ? Taking medicine that weakens bones, such as steroids, thyroid hormones, or hormone treatment for breast cancer or prostate cancer. ? Smoking. ? Drinking three or more alcoholic drinks a day. If you find that you have a low bone mass, you may be able to prevent osteoporosis or further bone loss by changing your diet and lifestyle. Where can I find more information? For more information, check out the following websites:  National Osteoporosis Foundation: www.nof.org/patients  National Institutes of Health: www.bones.nih.gov  International Osteoporosis Foundation: www.iofbonehealth.org Summary  The aging process leads to an overall loss of bone mass in the body, which can increase the likelihood of broken bones and osteoporosis.  Eating a well-balanced diet with plenty of calcium and vitamin D will help to protect your bones.  Weight-bearing and strength-building activities are also important for building and maintaining strong bones.  Bone mass can be measured with an X-ray test called a bone mineral density (BMD) test. This information is not intended to replace advice given to you by your health care provider. Make sure you discuss any questions you have with your health care provider. Document Revised: 11/15/2017 Document Reviewed: 11/15/2017 Elsevier Patient Education  2020 Elsevier Inc.   

## 2020-05-18 LAB — T3, FREE: T3, Free: 2.6 pg/mL (ref 2.0–4.4)

## 2020-05-18 LAB — TSH+FREE T4
Free T4: 1.27 ng/dL (ref 0.82–1.77)
TSH: 2.32 u[IU]/mL (ref 0.450–4.500)

## 2020-05-21 LAB — CYTOLOGY - PAP
Comment: NEGATIVE
Diagnosis: NEGATIVE
High risk HPV: NEGATIVE

## 2020-05-30 ENCOUNTER — Other Ambulatory Visit: Payer: Self-pay

## 2020-05-30 ENCOUNTER — Ambulatory Visit
Admission: RE | Admit: 2020-05-30 | Discharge: 2020-05-30 | Disposition: A | Discharge status: Home / Self Care | Payer: BC Managed Care – PPO | Source: Ambulatory Visit | Attending: Obstetrics and Gynecology | Admitting: Obstetrics and Gynecology

## 2020-05-30 DIAGNOSIS — E041 Nontoxic single thyroid nodule: Secondary | ICD-10-CM | POA: Diagnosis not present

## 2020-06-13 ENCOUNTER — Ambulatory Visit: Payer: PRIVATE HEALTH INSURANCE | Admitting: Dermatology

## 2020-06-26 ENCOUNTER — Other Ambulatory Visit: Payer: PRIVATE HEALTH INSURANCE

## 2020-07-10 ENCOUNTER — Ambulatory Visit
Admission: RE | Admit: 2020-07-10 | Discharge: 2020-07-10 | Disposition: A | Discharge status: Home / Self Care | Payer: BC Managed Care – PPO | Source: Ambulatory Visit | Attending: Infectious Diseases | Admitting: Infectious Diseases

## 2020-07-10 ENCOUNTER — Ambulatory Visit
Admission: RE | Admit: 2020-07-10 | Discharge: 2020-07-10 | Disposition: A | Discharge status: Home / Self Care | Payer: PRIVATE HEALTH INSURANCE | Source: Ambulatory Visit | Attending: Infectious Diseases | Admitting: Infectious Diseases

## 2020-07-10 DIAGNOSIS — N6489 Other specified disorders of breast: Secondary | ICD-10-CM

## 2020-07-12 ENCOUNTER — Other Ambulatory Visit: Payer: Self-pay | Admitting: Internal Medicine

## 2020-07-12 DIAGNOSIS — N6489 Other specified disorders of breast: Secondary | ICD-10-CM

## 2021-01-09 ENCOUNTER — Other Ambulatory Visit: Payer: Self-pay

## 2021-01-09 ENCOUNTER — Ambulatory Visit
Admission: RE | Admit: 2021-01-09 | Discharge: 2021-01-09 | Disposition: A | Discharge status: Home / Self Care | Payer: BC Managed Care – PPO | Source: Ambulatory Visit | Attending: Internal Medicine | Admitting: Internal Medicine

## 2021-01-09 DIAGNOSIS — N6489 Other specified disorders of breast: Secondary | ICD-10-CM

## 2021-02-20 IMAGING — MG DIGITAL SCREENING BILAT W/ TOMO W/ CAD
8 of 17 series · 8 of 40 positions shown · non-contrast
Comparison: None.

CLINICAL DATA: Screening.

EXAM:
DIGITAL SCREENING BILATERAL MAMMOGRAM WITH TOMO AND CAD

[R CC synth-2D (1 of 2)]
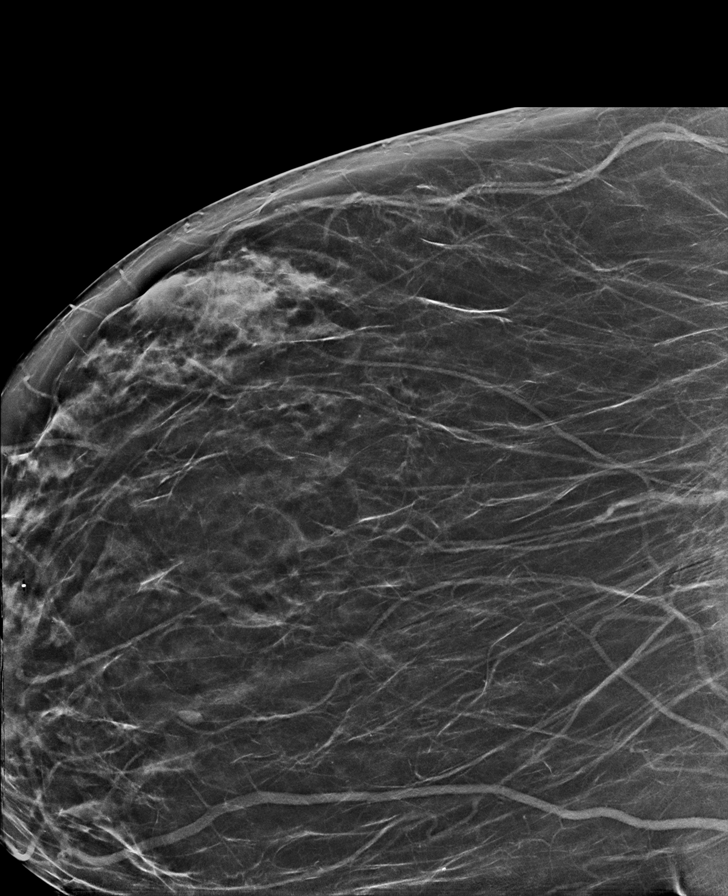

[L CC synth-2D (1 of 3)]
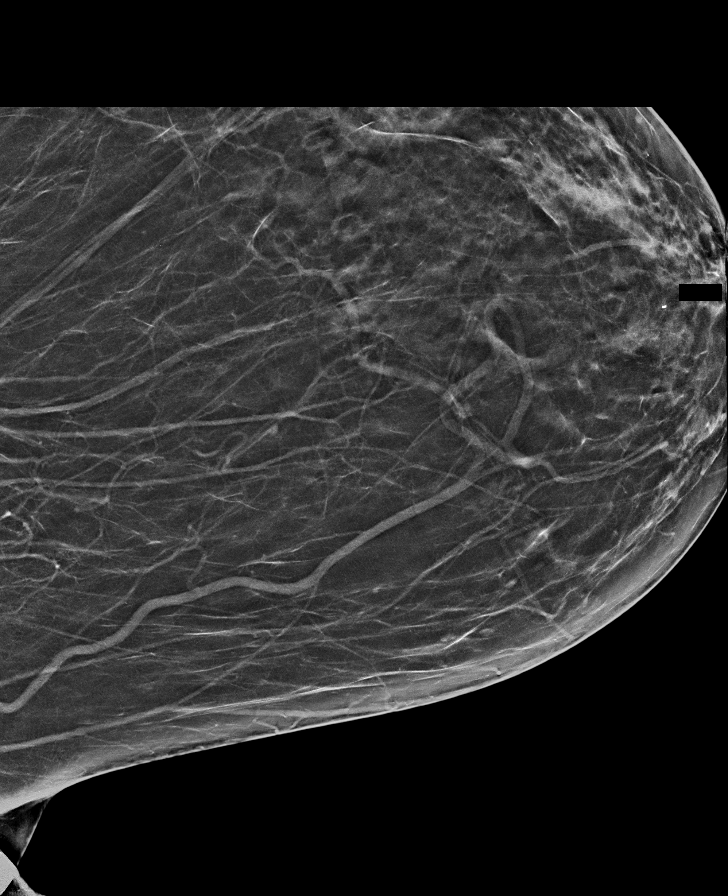

[L CC synth-2D (2 of 3)]
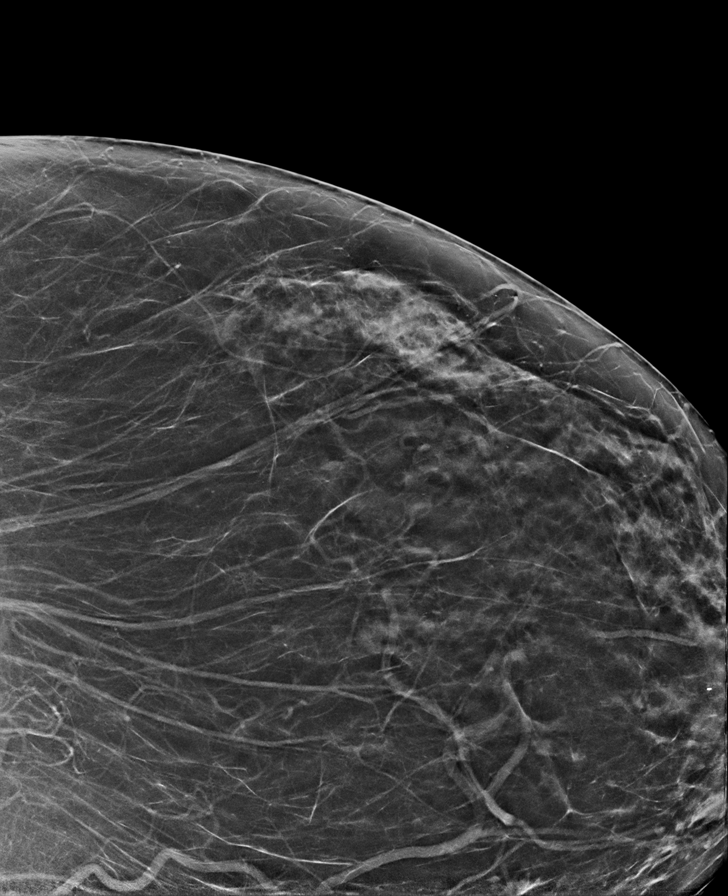

[L CC synth-2D (3 of 3)]
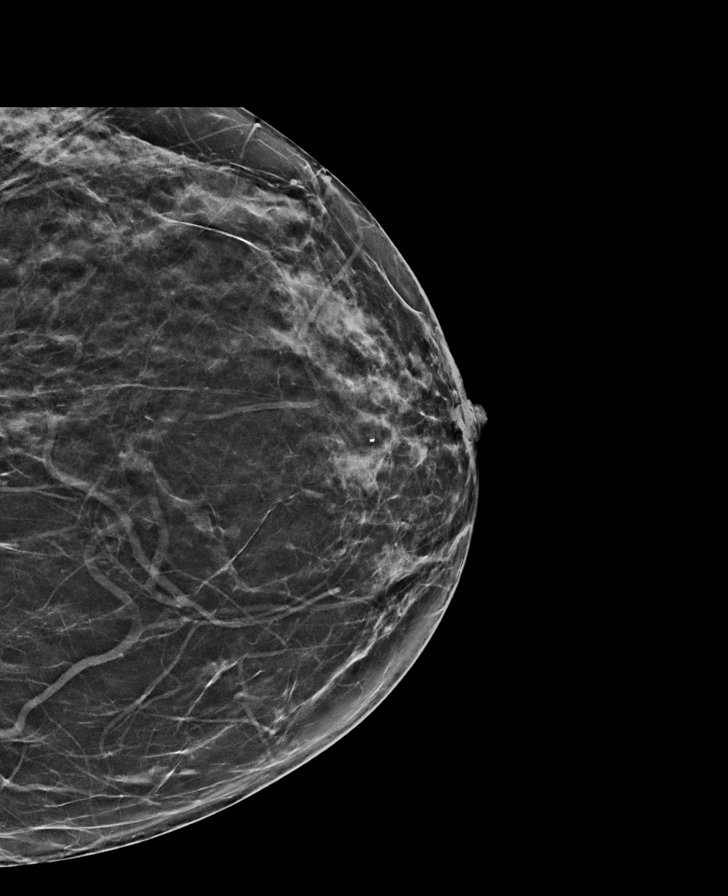

[R CC synth-2D (2 of 2)]
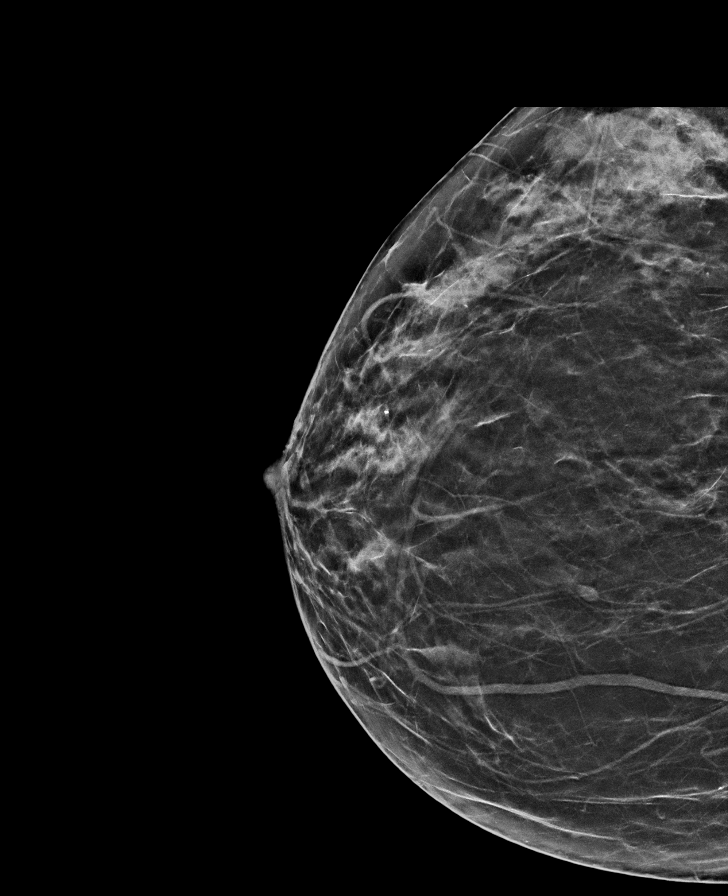

[L MLO synth-2D]
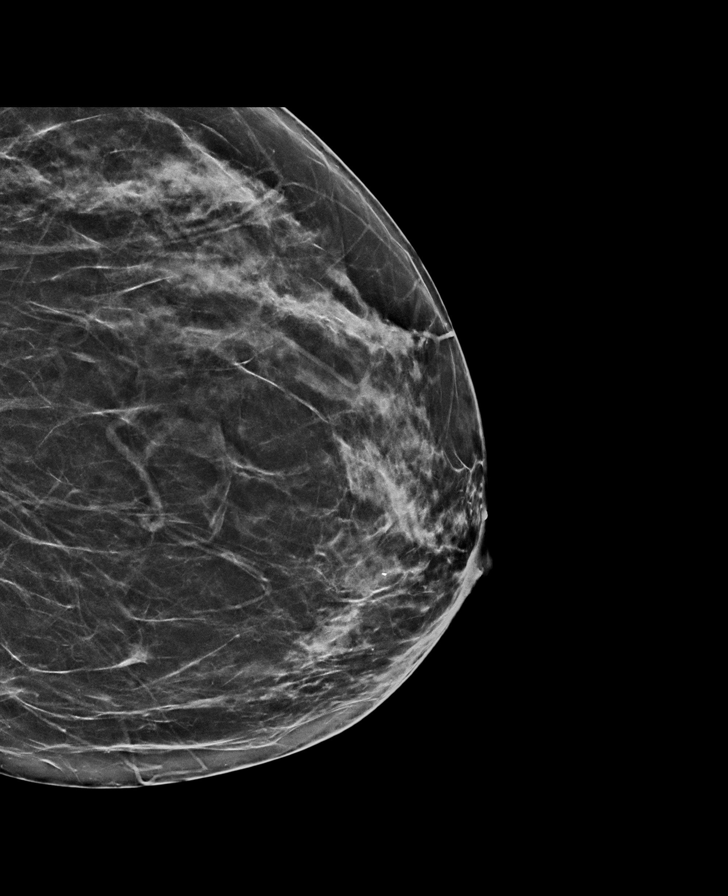

[R MLO synth-2D]
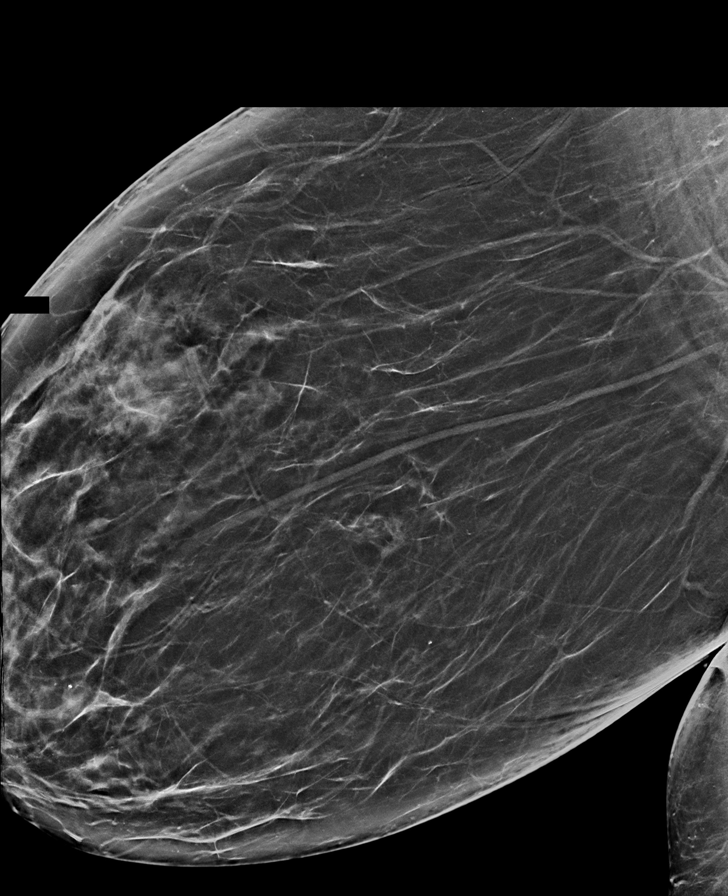

[R CC tomo · tomo slice 37/72.0]
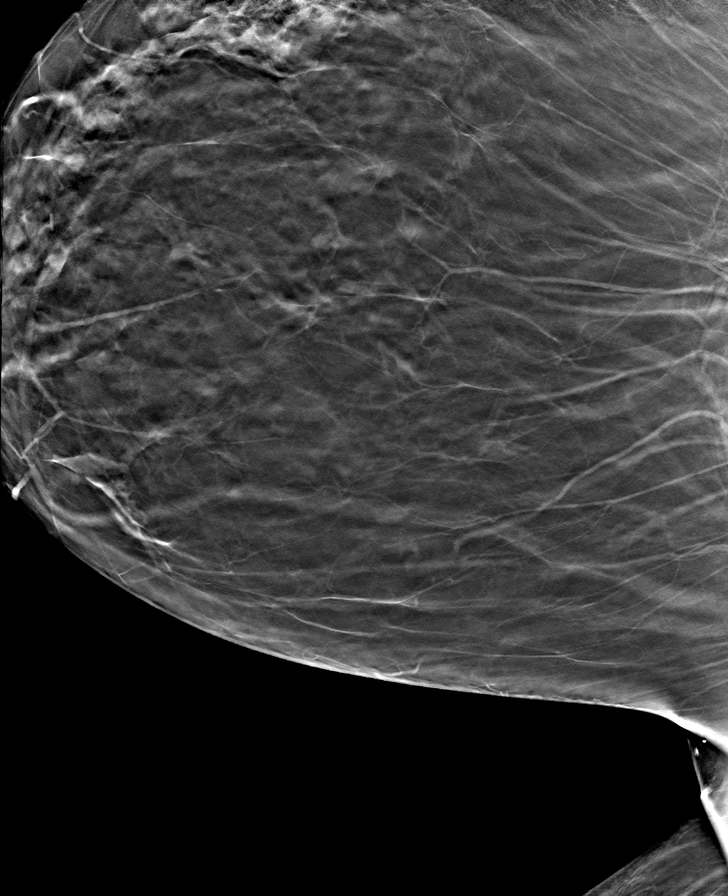

[8 of 40 positions shown; findings below may reference images not displayed]

ACR Breast Density Category b: There are scattered areas of
fibroglandular density.
FINDINGS: In the left breast, a possible asymmetry warrants further
evaluation. In the right breast, no findings suspicious for
malignancy. Images were processed with CAD.
IMPRESSION: Further evaluation is suggested for possible asymmetry in the left
breast.

RECOMMENDATION:
Diagnostic mammogram and possibly ultrasound of the left breast.
(Code:[id number]-[id number])

The patient will be contacted regarding the findings, and additional
imaging will be scheduled.

BI-RADS CATEGORY  0: Incomplete. Need additional imaging evaluation
and/or prior mammograms for comparison.

## 2021-03-15 IMAGING — US US BREAST*L* LIMITED INC AXILLA
1 series · 9 of 9 positions shown · non-contrast
Comparison: 12/06/2019 and earlier

CLINICAL DATA: Patient returns after screening for evaluation of
possible LEFT breast asymmetry.

EXAM:
DIGITAL DIAGNOSTIC LEFT MAMMOGRAM WITH CAD AND TOMO
ULTRASOUND LEFT BREAST

[Series 1: us breast*left* limited inc axilla · 0.07mm/px · 9 of 9 slices shown]
[im 1/9]
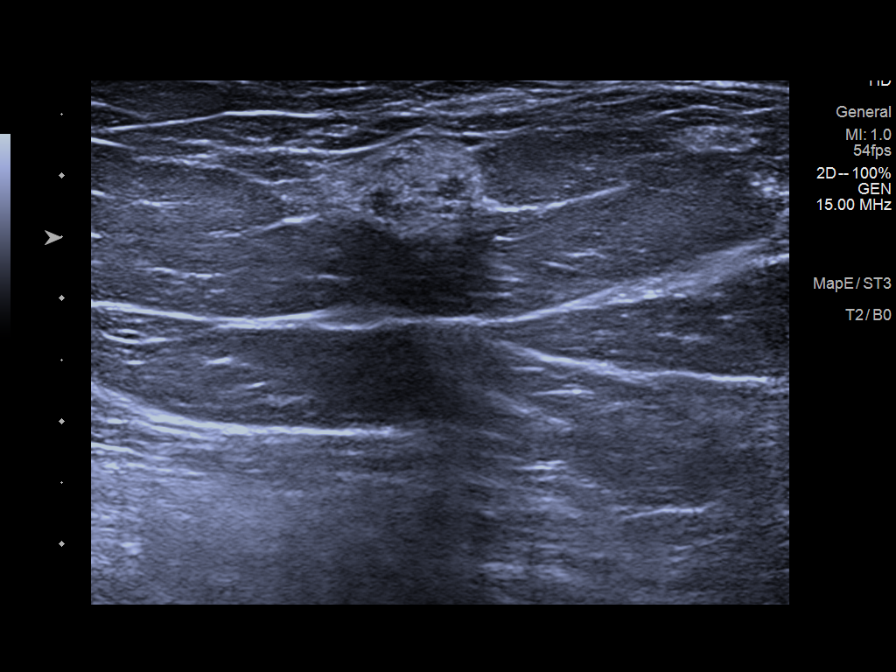
[im 2/9]
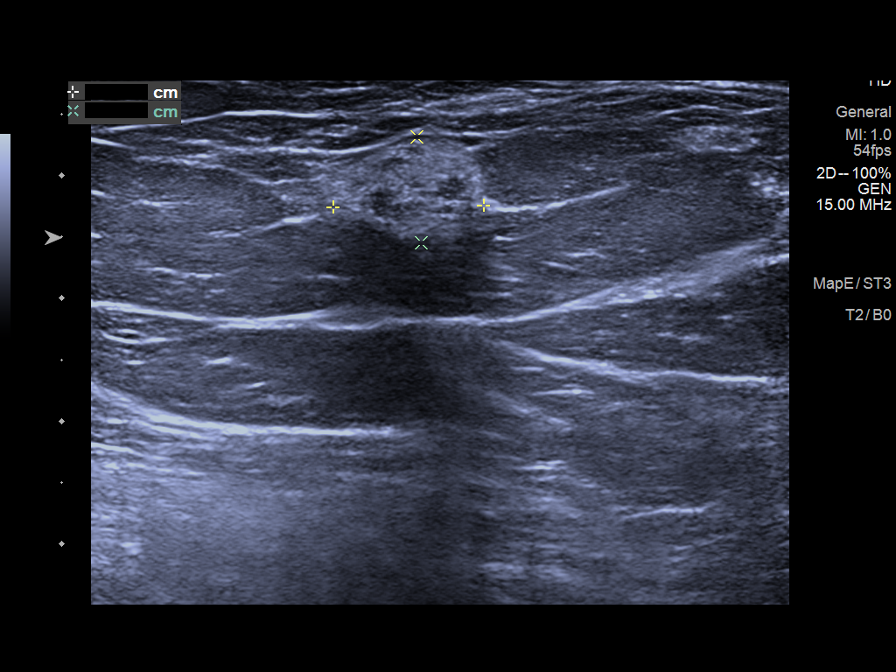
[im 3/9]
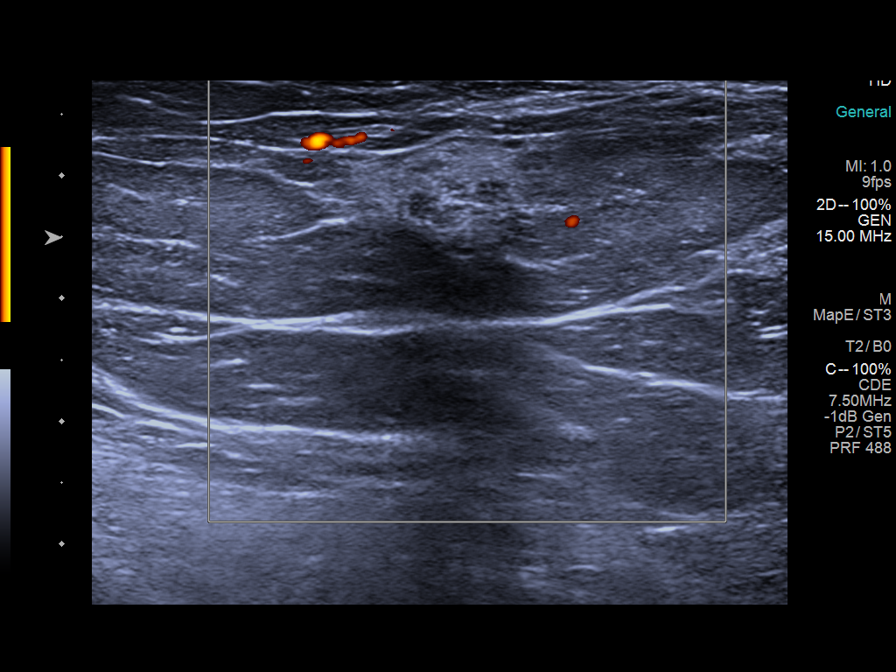
[im 4/9]
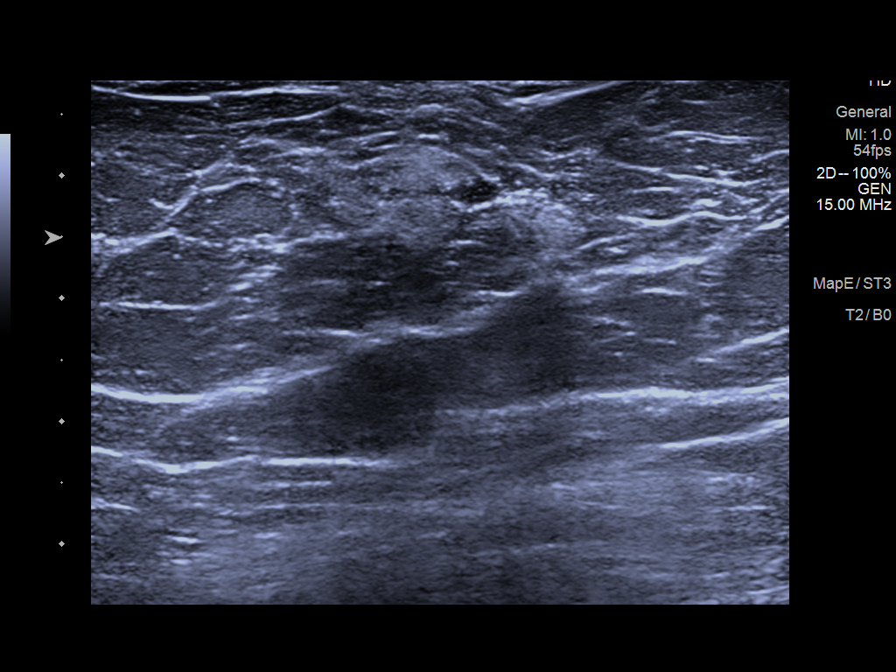
[im 5/9]
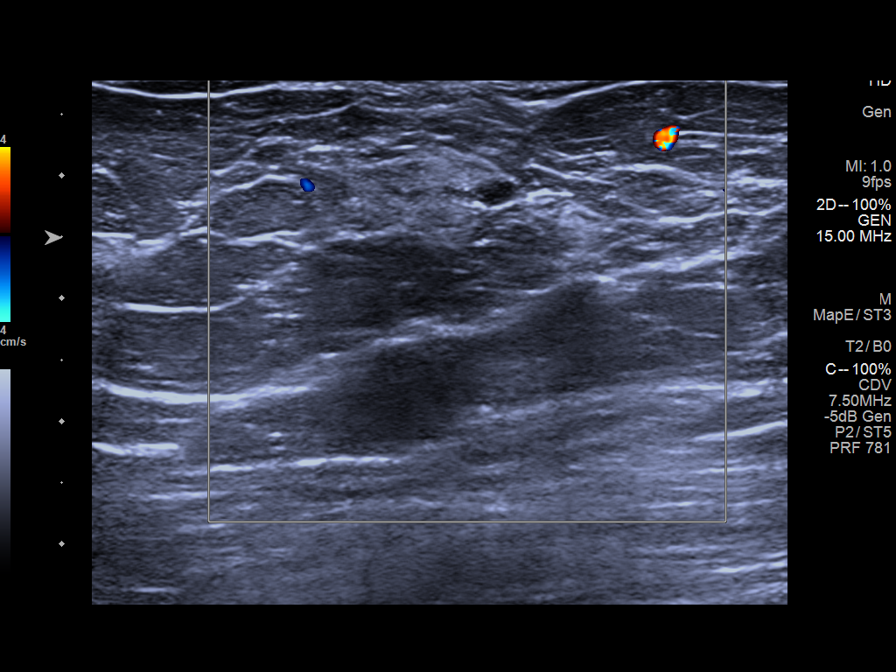
[im 6/9]
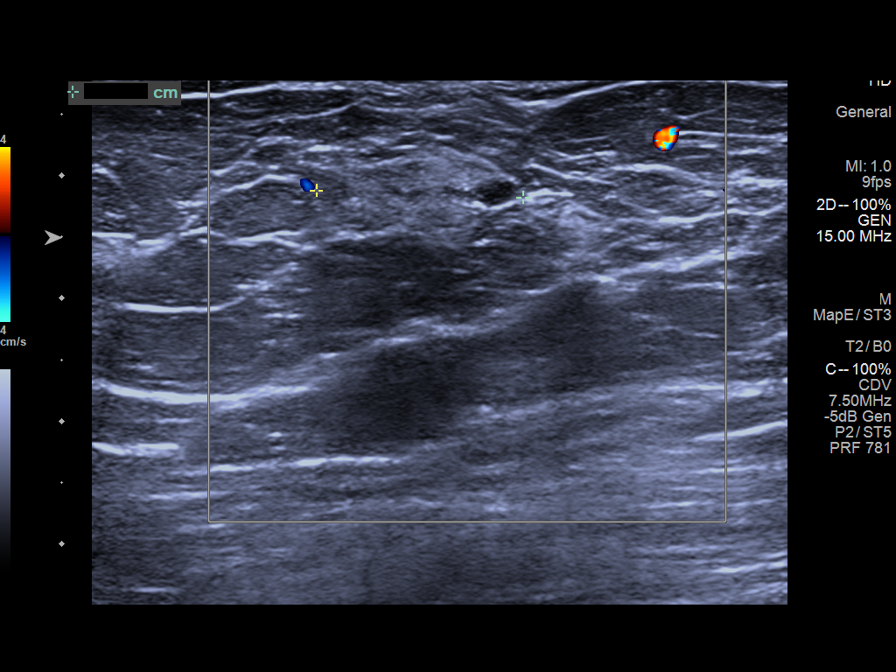
[im 7/9]
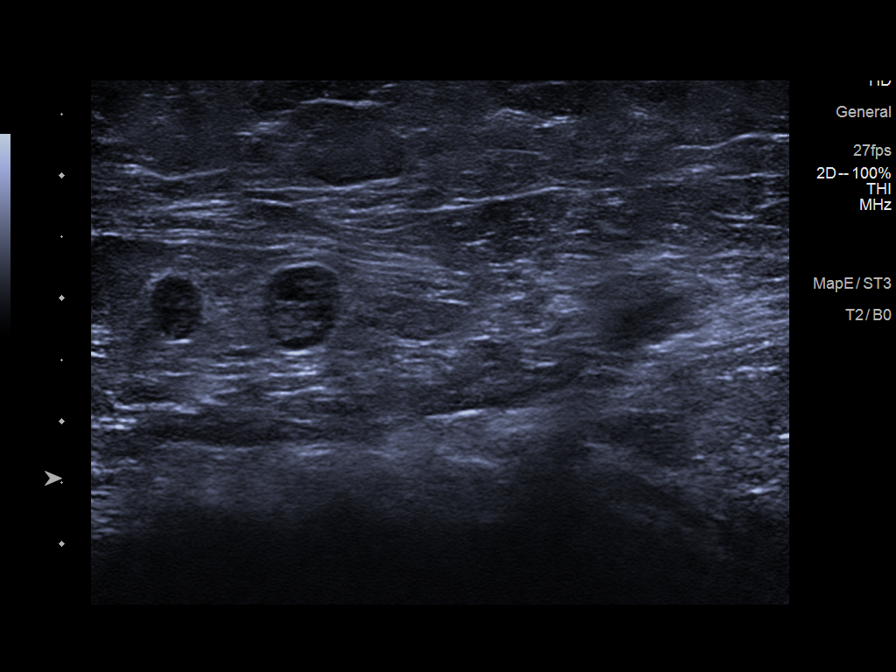
[im 8/9]
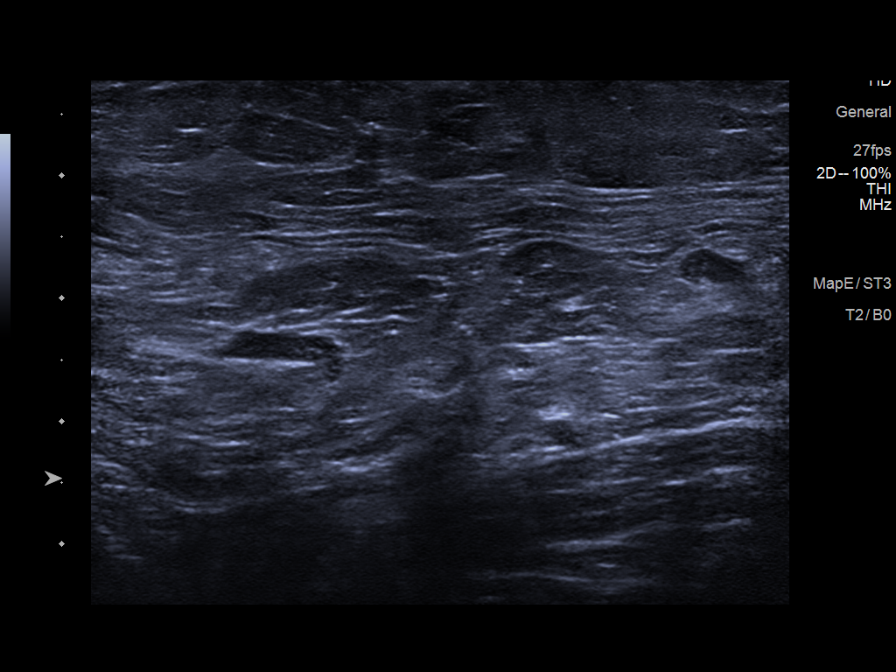
[im 9/9]
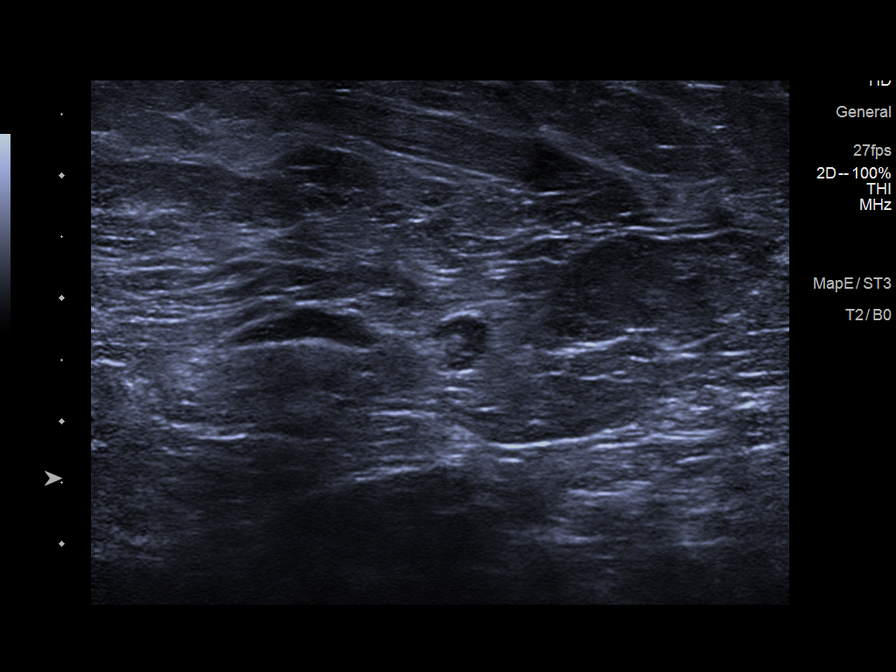

[9 of 9 positions shown; findings below may reference images not displayed]

ACR Breast Density Category b: There are scattered areas of
fibroglandular density.
FINDINGS: Additional 2-D and 3-D images are performed. These views show no
persistent mass in the MEDIAL anterior aspect of the LEFT breast.
Persistent asymmetry is identified just MEDIAL to the nipple, best
seen on tomosynthesis images. There is no definitive mass or
distortion.

Mammographic images were processed with CAD.

On physical exam, I palpate no abnormality in the MEDIAL aspect of
the LEFT retroareolar region.

Targeted ultrasound is performed, showing normal appearing
fibroglandular tissue in the MEDIAL aspect of the LEFT breast. A
focus of fibroglandular tissue in the 9:30 o'clock retroareolar
region measures approximately 1.2 centimeters and correlates well
with the mammographic appearance. Real-time evaluation of this
region and the surrounding parenchyma shows similar appearance of
scattered fibroglandular densities. Evaluation of the LEFT axilla is
negative for adenopathy.
IMPRESSION: Persistent asymmetry in the MEDIAL portion of the LEFT breast felt
represent benign breast parenchyma. Followup is recommended to
document stability.

RECOMMENDATION:
Recommend LEFT diagnostic mammogram and possible LEFT breast
ultrasound in 6 months to assess stability.

I have discussed the findings and recommendations with the patient.
If applicable, a reminder letter will be sent to the patient
regarding the next appointment.

BI-RADS CATEGORY  3: Probably benign.

## 2021-05-23 ENCOUNTER — Other Ambulatory Visit: Payer: Self-pay

## 2021-05-23 ENCOUNTER — Encounter: Payer: Self-pay | Admitting: Advanced Practice Midwife

## 2021-05-23 ENCOUNTER — Ambulatory Visit (INDEPENDENT_AMBULATORY_CARE_PROVIDER_SITE_OTHER): Payer: BC Managed Care – PPO | Admitting: Advanced Practice Midwife

## 2021-05-23 VITALS — BP 132/78 | HR 89 | Ht 68.0 in | Wt 296.0 lb

## 2021-05-23 DIAGNOSIS — L0232 Furuncle of buttock: Secondary | ICD-10-CM

## 2021-05-23 MED ORDER — AMOXICILLIN 500 MG PO CAPS: 500.0000 mg | ORAL_CAPSULE | Freq: Three times a day (TID) | ORAL | 0 refills | Status: AC

## 2021-05-25 ENCOUNTER — Encounter: Payer: Self-pay | Admitting: Advanced Practice Midwife

## 2021-05-25 NOTE — Progress Notes (Signed)
Patient ID: Shelly Kaufman, female   DOB: Dec 05, 1960, 60 y.o.   MRN: [id number]  Reason for Consult: Rectal Pain (complains of a raw area between her buttocks area x 3 days.She contributes it to her underwear rubbing against her skin and causing it to become raw.  Room#2)    Subjective:  Date of Service: 05/23/2021  HPI:  Shelly Kaufman is a 60 y.o. female being seen for a painful bump on her buttock that she first noticed 3 or 4 days ago. At that time it was a small bump and it has become longer. She thought it may have been a "hair" bump. It now feels hard and it is painful to sit. She admits a fever last night and no other symptoms. She has not seen the bump and does not know if there is redness or other signs of infection. She has no other concerns at this time.   Past Medical History:  Diagnosis Date   Hypertension    Family History  Problem Relation Age of Onset   Breast cancer Maternal Aunt 67   History reviewed. No pertinent surgical history.  Short Social History:  Social History   Tobacco Use   Smoking status: Never   Smokeless tobacco: Never  Substance Use Topics   Alcohol use: Yes    No Known Allergies  Current Outpatient Medications  Medication Sig Dispense Refill   amoxicillin (AMOXIL) 500 MG capsule Take 1 capsule (500 mg total) by mouth 3 (three) times daily for 10 days. 30 capsule 0   Cholecalciferol (VITAMIN D3) 25 MCG (1000 UT) CAPS Take by mouth.     hydrochlorothiazide (HYDRODIURIL) 12.5 MG tablet Take 12.5 mg by mouth every other day.     lisinopril (PRINIVIL,ZESTRIL) 5 MG tablet Take by mouth.     Multiple Vitamin (MULTI-VITAMIN) tablet Take 1 tablet by mouth daily.     traZODone (DESYREL) 50 MG tablet Take 50 mg by mouth at bedtime.     No current facility-administered medications for this visit.   Review of Systems  Constitutional:  Negative for chills and fever.  HENT:  Negative for congestion, ear discharge, ear pain, hearing loss,  sinus pain and sore throat.   Eyes:  Negative for blurred vision and double vision.  Respiratory:  Negative for cough, shortness of breath and wheezing.   Cardiovascular:  Negative for chest pain, palpitations and leg swelling.  Gastrointestinal:  Negative for abdominal pain, blood in stool, constipation, diarrhea, heartburn, melena, nausea and vomiting.  Genitourinary:  Negative for dysuria, flank pain, frequency, hematuria and urgency.  Musculoskeletal:  Negative for back pain, joint pain and myalgias.  Skin:  Negative for itching and rash.       Positive for large hard painful bump on left buttock  Neurological:  Negative for dizziness, tingling, tremors, sensory change, speech change, focal weakness, seizures, loss of consciousness, weakness and headaches.  Endo/Heme/Allergies:  Negative for environmental allergies. Does not bruise/bleed easily.  Psychiatric/Behavioral:  Negative for depression, hallucinations, memory loss, substance abuse and suicidal ideas. The patient is not nervous/anxious and does not have insomnia.        Objective:  Objective   Vitals:   05/23/21 1508  BP: 132/78  Pulse: 89  Weight: 296 lb (134.3 kg)  Height: 5\' 8"  (1.727 m)   Body mass index is 45.01 kg/m. Constitutional: Well nourished, well developed female in no acute distress.  HEENT: normal Skin: Warm and dry.  Cardiovascular: Regular rate and rhythm.  Respiratory: Clear to auscultation bilateral. Normal respiratory effort Neuro: DTRs 2+, Cranial nerves grossly intact Psych: Alert and Oriented x3. No memory deficits. Normal mood and affect.  MS: normal gait, normal bilateral lower extremity ROM/strength/stability.  Pelvic exam:  is not limited by body habitus EGBUS: approximately 7 cm x 4 cm hard area on left buttock, tender to palpation, not hot or red, possible "head" developing at inferior aspect  Physical Exam Genitourinary:         Assessment/Plan:     60 y.o. female with  likely abscess of left buttock  Rx amoxicillin 500 mg TID for 10 days Warm compresses to affected area one or two times daily Return for MD visit for worsening symptoms    Tresea Mall CNM [facility] 05/25/2021, 11:50 AM

## 2021-05-26 ENCOUNTER — Telehealth: Payer: Self-pay

## 2021-05-26 NOTE — Telephone Encounter (Signed)
Pt calling; boil ruptured; has an aweful smell; dark blood.  [phone]  VM not set up yet.

## 2021-05-28 NOTE — Telephone Encounter (Signed)
Pt states boil is not draining as much but still draining and the smell is a lot better; adv to keep it cleam and dry; draining is good; pt states no more fever since rupture.

## 2021-06-18 ENCOUNTER — Other Ambulatory Visit
Admission: RE | Admit: 2021-06-18 | Discharge: 2021-06-18 | Disposition: A | Discharge status: Home / Self Care | Payer: BC Managed Care – PPO | Source: Ambulatory Visit | Attending: Internal Medicine | Admitting: Internal Medicine

## 2021-06-18 DIAGNOSIS — I10 Essential (primary) hypertension: Secondary | ICD-10-CM | POA: Diagnosis present

## 2021-06-18 DIAGNOSIS — Z8679 Personal history of other diseases of the circulatory system: Secondary | ICD-10-CM | POA: Diagnosis present

## 2021-06-18 DIAGNOSIS — R0602 Shortness of breath: Secondary | ICD-10-CM | POA: Insufficient documentation

## 2021-06-18 DIAGNOSIS — Z79899 Other long term (current) drug therapy: Secondary | ICD-10-CM | POA: Diagnosis present

## 2021-06-18 LAB — BRAIN NATRIURETIC PEPTIDE: B Natriuretic Peptide: 30 pg/mL (ref 0.0–100.0)

## 2021-06-30 ENCOUNTER — Ambulatory Visit: Payer: PRIVATE HEALTH INSURANCE | Admitting: Obstetrics and Gynecology

## 2021-07-23 ENCOUNTER — Other Ambulatory Visit: Payer: Self-pay

## 2021-07-23 ENCOUNTER — Ambulatory Visit (INDEPENDENT_AMBULATORY_CARE_PROVIDER_SITE_OTHER): Payer: BC Managed Care – PPO | Admitting: Obstetrics and Gynecology

## 2021-07-23 ENCOUNTER — Encounter: Payer: Self-pay | Admitting: Obstetrics and Gynecology

## 2021-07-23 VITALS — BP 128/74 | Ht 68.0 in | Wt 296.0 lb

## 2021-07-23 DIAGNOSIS — Z0000 Encounter for general adult medical examination without abnormal findings: Secondary | ICD-10-CM | POA: Diagnosis not present

## 2021-07-23 DIAGNOSIS — Z1231 Encounter for screening mammogram for malignant neoplasm of breast: Secondary | ICD-10-CM

## 2021-07-23 DIAGNOSIS — Z01419 Encounter for gynecological examination (general) (routine) without abnormal findings: Secondary | ICD-10-CM

## 2021-07-23 DIAGNOSIS — R928 Other abnormal and inconclusive findings on diagnostic imaging of breast: Secondary | ICD-10-CM | POA: Diagnosis not present

## 2021-07-23 NOTE — Patient Instructions (Signed)
Exercising to Stay Healthy To become healthy and stay healthy, it is recommended that you do moderate-intensity and vigorous-intensity exercise. You can tell that you are exercising at a moderate intensity if your heart starts beating faster and you start breathing faster but can still hold a conversation. You can tell that you are exercising at a vigorous intensity if you are breathing much harder and faster and cannot hold a conversation while exercising. How can exercise benefit me? Exercising regularly is important. It has many health benefits, such as: Improving overall fitness, flexibility, and endurance. Increasing bone density. Helping with weight control. Decreasing body fat. Increasing muscle strength and endurance. Reducing stress and tension, anxiety, depression, or anger. Improving overall health. What guidelines should I follow while exercising? Before you start a new exercise program, talk with your health care provider. Do not exercise so much that you hurt yourself, feel dizzy, or get very short of breath. Wear comfortable clothes and wear shoes with good support. Drink plenty of water while you exercise to prevent dehydration or heat stroke. Work out until your breathing and your heartbeat get faster (moderate intensity). How often should I exercise? Choose an activity that you enjoy, and set realistic goals. Your health care provider can help you make an activity plan that is individually designed and works best for you. Exercise regularly as told by your health care provider. This may include: Doing strength training two times a week, such as: Lifting weights. Using resistance bands. Push-ups. Sit-ups. Yoga. Doing a certain intensity of exercise for a given amount of time. Choose from these options: A total of 150 minutes of moderate-intensity exercise every week. A total of 75 minutes of vigorous-intensity exercise every week. A mix of moderate-intensity and  vigorous-intensity exercise every week. Children, pregnant women, people who have not exercised regularly, people who are overweight, and older adults may need to talk with a health care provider about what activities are safe to perform. If you have a medical condition, be sure to talk with your health care provider before you start a new exercise program. What are some exercise ideas? Moderate-intensity exercise ideas include: Walking 1 mile (1.6 km) in about 15 minutes. Biking. Hiking. Golfing. Dancing. Water aerobics. Vigorous-intensity exercise ideas include: Walking 4.5 miles (7.2 km) or more in about 1 hour. Jogging or running 5 miles (8 km) in about 1 hour. Biking 10 miles (16.1 km) or more in about 1 hour. Lap swimming. Roller-skating or in-line skating. Cross-country skiing. Vigorous competitive sports, such as football, basketball, and soccer. Jumping rope. Aerobic dancing. What are some everyday activities that can help me get exercise? Yard work, such as: Child psychotherapist. Raking and bagging leaves. Washing your car. Pushing a stroller. Shoveling snow. Gardening. Washing windows or floors. How can I be more active in my day-to-day activities? Use stairs instead of an elevator. Take a walk during your lunch break. If you drive, park your car farther away from your work or school. If you take public transportation, get off one stop early and walk the rest of the way. Stand up or walk around during all of your indoor phone calls. Get up, stretch, and walk around every 30 minutes throughout the day. Enjoy exercise with a friend. Support to continue exercising will help you keeBudget-Friendly Healthy Eating There are many ways to save money at the grocery store and continue to eat healthy. You can be successful if you: Plan meals according to your budget. Make a grocery list and  only purchase food according to your grocery list. Prepare food yourself at home. What  are tips for following this plan? Reading food labels Compare food labels between brand name foods and the store brand. Often the nutritional value is the same, but the store brand is lower cost. Look for products that do not have added sugar, fat, or salt (sodium). These often cost the same but are healthier for you. Products may be labeled as: Sugar-free. Nonfat. Low-fat. Sodium-free. Low-sodium. Look for lean ground beef labeled as at least 92% lean and 8% fat. Shopping  Buy only the items on your grocery list and go only to the areas of the store that have the items on your list. Use coupons only for foods and brands you normally buy. Avoid buying items you wouldn't normally buy simply because they are on sale. Check online and in newspapers for weekly deals. Buy healthy items from the bulk bins when available, such as herbs, spices, flour, pasta, nuts, and dried fruit. Buy fruits and vegetables that are in season. Prices are usually lower on in-season produce. Look at the unit price on the price tag. Use it to compare different brands and sizes to find out which item is the best deal. Choose healthy items that are often low-cost, such as carrots, potatoes, apples, bananas, and oranges. Dried or canned beans are a low-cost protein source. Buy in bulk and freeze extra food. Items you can buy in bulk include meats, fish, poultry, frozen fruits, and frozen vegetables. Avoid buying "ready-to-eat" foods, such as pre-cut fruits and vegetables and pre-made salads. If possible, shop around to discover where you can find the best prices. Consider other retailers such as dollar stores, larger [employer], local fruit and vegetable stands, and farmers markets. Do not shop when you are hungry. If you shop while hungry, it may be hard to stick to your list and budget. Resist impulse buying. Use your grocery list as your official plan for the week. Buy a variety of vegetables and fruits by  purchasing fresh, frozen, and canned items. Look at the top and bottom shelves for deals. Foods at eye level (eye level of an adult or child) are usually more expensive. Be efficient with your time when shopping. The more time you spend at the store, the more money you are likely to spend. To save money when choosing more expensive foods like meats and dairy: Choose cheaper cuts of meat, such as bone-in chicken thighs and drumsticks instead of skinless and boneless chicken. When you are ready to prepare the chicken, you can remove the skin yourself to make it healthier. Choose lean meats like chicken or [place] instead of beef. Choose canned seafood, such as tuna, salmon, or sardines. Buy eggs as a low-cost source of protein. Buy dried beans and peas, such as lentils, split peas, or kidney beans instead of meats. Dried beans and peas are a good alternative source of protein. Buy the larger tubs of yogurt instead of individual-sized containers. Choose water instead of sodas and other sweetened beverages. Avoid buying chips, cookies, and other "junk food." These items are usually expensive and not healthy. Cooking Make extra food and freeze the extras in meal-sized containers or in individual portions for fast meals and snacks. Pre-cook on days when you have extra time to prepare meals in advance. You can keep these meals in the fridge or freezer and reheat for a quick meal. When you come home from the grocery store, wash, peel, and cut  fruits and vegetables so they are ready to use and eat. This will help reduce food waste. Meal planning Do not eat out or get fast food. Prepare food at home. Make a grocery list and make sure to bring it with you to the store. If you have a smart phone, you could use your phone to create your shopping list. Plan meals and snacks according to a grocery list and budget you create. Use leftovers in your meal plan for the week. Look for recipes where you can cook once  and make enough food for two meals. Prepare budget-friendly types of meals like stews, casseroles, and stir-fry dishes. Try some meatless meals or try "no cook" meals like salads. Make sure that half your plate is filled with fruits or vegetables. Choose from fresh, frozen, or canned fruits and vegetables. If eating canned, remember to rinse them before eating. This will remove any excess salt added for packaging. Summary Eating healthy on a budget is possible if you plan your meals according to your budget, purchase according to your budget and grocery list, and prepare food yourself. Tips for buying more food on a limited budget include buying generic brands, using coupons only for foods you normally buy, and buying healthy items from the bulk bins when available. Tips for buying cheaper food to replace expensive food include choosing cheaper, lean cuts of meat, and buying dried beans and peas. This information is not intended to replace advice given to you by your health care provider. Make sure you discuss any questions you have with your health care provider. Document Revised: 08/01/2020 Document Reviewed: 08/01/2020 Elsevier Patient Education  2022 Elsevier Inc. Bone Health Bones protect organs, store calcium, anchor muscles, and support the whole body. Keeping your bones strong is important, especially as you get older. You can take actions to help keep your bones strong and healthy. Why is keeping my bones healthy important? Keeping your bones healthy is important because your body constantly replaces bone cells. Cells get old, and new cells take their place. As we age, we lose bone cells because the body may not be able to make enough new cells to replace the old cells. The amount of bone cells and bone tissue you have is referred to as bone mass. The higher your bone mass, the stronger your bones. The aging process leads to an overall loss of bone mass in the body, which can increase the  likelihood of: Joint pain and stiffness. Broken bones. A condition in which the bones become weak and brittle (osteoporosis). A large decline in bone mass occurs in older adults. In women, it occurs about the time of menopause. What actions can I take to keep my bones healthy? Good health habits are important for maintaining healthy bones. This includes eating nutritious foods and exercising regularly. To have healthy bones, you need to get enough of the right minerals and vitamins. Most nutrition experts recommend getting these nutrients from the foods that you eat. In some cases, taking supplements may also be recommended. Doing certain types of exercise is also important for bone health. What are the nutritional recommendations for healthy bones? Eating a well-balanced diet with plenty of calcium and vitamin D will help to protect your bones. Nutritional recommendations vary from person to person. Ask your health care provider what is healthy for you. Here are some general guidelines. Get enough calcium Calcium is the most important (essential) mineral for bone health. Most people can get enough calcium from their  diet, but supplements may be recommended for people who are at risk for osteoporosis. Good sources of calcium include: Dairy products, such as low-fat or nonfat milk, cheese, and yogurt. Dark green leafy vegetables, such as bok choy and broccoli. Calcium-fortified foods, such as orange juice, cereal, bread, soy beverages, and tofu products. Nuts, such as almonds. Follow these recommended amounts for daily calcium intake: Children, age 84-3: 700 mg. Children, age 71-8: 1,000 mg. Children, age 56-13: 1,300 mg. Teens, age 55-18: 1,300 mg. Adults, age 20-50: 1,000 mg. Adults, age 8-70: Men: 1,000 mg. Women: 1,200 mg. Adults, age 24 or older: 1,200 mg. Pregnant and breastfeeding females: Teens: 1,300 mg. Adults: 1,000 mg. Get enough vitamin D Vitamin D is the most essential vitamin  for bone health. It helps the body absorb calcium. Sunlight stimulates the skin to make vitamin D, so be sure to get enough sunlight. If you live in a cold climate or you do not get outside often, your health care provider may recommend that you take vitamin D supplements. Good sources of vitamin D in your diet include: Egg yolks. Saltwater fish. Milk and cereal fortified with vitamin D. Follow these recommended amounts for daily vitamin D intake: Children and teens, age 84-18: 600 international units. Adults, age 86 or younger: 400-800 international units. Adults, age 75 or older: 800-1,000 international units. Get other important nutrients Other nutrients that are important for bone health include: Phosphorus. This mineral is found in meat, poultry, dairy foods, nuts, and legumes. The recommended daily intake for adult men and adult women is 700 mg. Magnesium. This mineral is found in seeds, nuts, dark green vegetables, and legumes. The recommended daily intake for adult men is 400-420 mg. For adult women, it is 310-320 mg. Vitamin K. This vitamin is found in green leafy vegetables. The recommended daily intake is 120 mg for adult men and 90 mg for adult women. What type of physical activity is best for building and maintaining healthy bones? Weight-bearing and strength-building activities are important for building and maintaining healthy bones. Weight-bearing activities cause muscles and bones to work against gravity. Strength-building activities increase the strength of the muscles that support bones. Weight-bearing and muscle-building activities include: Walking and hiking. Jogging and running. Dancing. Gym exercises. Lifting weights. Tennis and racquetball. Climbing stairs. Aerobics. Adults should get at least 30 minutes of moderate physical activity on most days. Children should get at least 60 minutes of moderate physical activity on most days. Ask your health care provider what type  of exercise is best for you. How can I find out if my bone mass is low? Bone mass can be measured with an X-ray test called a bone mineral density (BMD) test. This test is recommended for all women who are age 57 or older. It may also be recommended for: Men who are age 5 or older. People who are at risk for osteoporosis because of: Having bones that break easily. Having a long-term disease that weakens bones, such as kidney disease or rheumatoid arthritis. Having menopause earlier than normal. Taking medicine that weakens bones, such as steroids, thyroid hormones, or hormone treatment for breast cancer or prostate cancer. Smoking. Drinking three or more alcoholic drinks a day. If you find that you have a low bone mass, you may be able to prevent osteoporosis or further bone loss by changing your diet and lifestyle. Where can I find more information? For more information, check out the following websites: National Osteoporosis Foundation: https://carlson-fletcher.info/ [employer]: www.bones.http://www.myers.net/  International Osteoporosis Foundation: Investment banker, operational.iofbonehealth.org Summary The aging process leads to an overall loss of bone mass in the body, which can increase the likelihood of broken bones and osteoporosis. Eating a well-balanced diet with plenty of calcium and vitamin D will help to protect your bones. Weight-bearing and strength-building activities are also important for building and maintaining strong bones. Bone mass can be measured with an X-ray test called a bone mineral density (BMD) test. This information is not intended to replace advice given to you by your health care provider. Make sure you discuss any questions you have with your health care provider. Document Revised: 11/15/2017 Document Reviewed: 11/15/2017 Elsevier Patient Education  2022 Elsevier Inc. p a regular routine of activity. Where to find more information You can find more information about exercising to stay  healthy from: U.S. Department of Health and Human Services: ThisPath.fi Centers for Disease Control and Prevention (CDC): FootballExhibition.com.br Summary Exercising regularly is important. It will improve your overall fitness, flexibility, and endurance. Regular exercise will also improve your overall health. It can help you control your weight, reduce stress, and improve your bone density. Do not exercise so much that you hurt yourself, feel dizzy, or get very short of breath. Before you start a new exercise program, talk with your health care provider. This information is not intended to replace advice given to you by your health care provider. Make sure you discuss any questions you have with your health care provider. Document Revised: 02/14/2021 Document Reviewed: 02/14/2021 Elsevier Patient Education  2022 [employer].

## 2021-07-23 NOTE — Progress Notes (Signed)
Gynecology Annual Exam  PCP: Mick Sell, MD  Chief Complaint:  Chief Complaint  Patient presents with   Gynecologic Exam    History of Present Illness: Patient is a 60 y.o. No obstetric history on file. presents for annual exam. The patient has no complaints today.   LMP: No LMP recorded. Patient is postmenopausal. She denies postmenopausal bleeding or spotting  The patient is not sexually active.    The patient does perform self breast exams.  There is notable family history of breast or ovarian cancer in her family.  The patient has regular exercise: yes, reports working out with a Systems analyst 2 days a week.   The patient denies current symptoms of depression.   Review of Systems: Review of Systems  Constitutional:  Negative for chills, fever, malaise/fatigue and weight loss.  HENT:  Negative for congestion, hearing loss and sinus pain.   Eyes:  Negative for blurred vision and double vision.  Respiratory:  Negative for cough, sputum production, shortness of breath and wheezing.   Cardiovascular:  Negative for chest pain, palpitations, orthopnea and leg swelling.  Gastrointestinal:  Negative for abdominal pain, constipation, diarrhea, nausea and vomiting.  Genitourinary:  Negative for dysuria, flank pain, frequency, hematuria and urgency.  Musculoskeletal:  Negative for back pain, falls and joint pain.  Skin:  Negative for itching and rash.  Neurological:  Negative for dizziness and headaches.  Psychiatric/Behavioral:  Negative for depression, substance abuse and suicidal ideas. The patient is not nervous/anxious.    Past Medical History:  Past Medical History:  Diagnosis Date   Hypertension     Past Surgical History:  History reviewed. No pertinent surgical history.  Gynecologic History:  No LMP recorded. Patient is postmenopausal. Last Pap: Results were: NIL 2021  Last mammogram: 2022 Results were: BI-RAD III  Obstetric History: No obstetric  history on file.  Family History:  Family History  Problem Relation Age of Onset   Breast cancer Maternal Aunt 76    Social History:  Social History   Socioeconomic History   Marital status: Married    Spouse name: Not on file   Number of children: Not on file   Years of education: Not on file   Highest education level: Not on file  Occupational History   Not on file  Tobacco Use   Smoking status: Never   Smokeless tobacco: Never  Vaping Use   Vaping Use: Never used  Substance and Sexual Activity   Alcohol use: Yes   Drug use: Never   Sexual activity: Not Currently  Other Topics Concern   Not on file  Social History Narrative   Not on file   Social Determinants of Health   Financial Resource Strain: Not on file  Food Insecurity: Not on file  Transportation Needs: Not on file  Physical Activity: Not on file  Stress: Not on file  Social Connections: Not on file  Intimate Partner Violence: Not on file    Allergies:  No Known Allergies  Medications: Prior to Admission medications   Medication Sig Start Date End Date Taking? Authorizing Provider  Cholecalciferol (VITAMIN D3) 25 MCG (1000 UT) CAPS Take by mouth.   Yes [provider]  hydrochlorothiazide (HYDRODIURIL) 12.5 MG tablet Take 12.5 mg by mouth every other day. 05/09/21 05/09/22 Yes [provider]  lisinopril (PRINIVIL,ZESTRIL) 5 MG tablet Take by mouth.   Yes [provider]  Multiple Vitamin (MULTI-VITAMIN) tablet Take 1 tablet by mouth daily.  Yes [provider]  rosuvastatin (CRESTOR) 20 MG tablet Take by mouth. 06/18/21 06/18/22 Yes [provider]  traZODone (DESYREL) 50 MG tablet Take 50 mg by mouth at bedtime. Patient not taking: Reported on 07/23/2021 05/09/21   [provider]    Physical Exam Vitals: Blood pressure 128/74, height 5\' 8"  (1.727 m), weight 296 lb (134.3 kg).  Physical Exam Constitutional:      Appearance: She is well-developed.   Genitourinary:     Genitourinary Comments: External: Normal appearing vulva. No lesions noted.   Bimanual examination: Uterus midline, non-tender, normal in size, shape and contour.  No CMT. No adnexal masses. No adnexal tenderness. Pelvis not fixed.  Breast Exam: breast equal without skin changes, nipple discharge, breast lump or enlarged lymph nodes   HENT:     Head: Normocephalic and atraumatic.  Neck:     Thyroid: No thyromegaly.  Cardiovascular:     Rate and Rhythm: Normal rate and regular rhythm.     Heart sounds: Normal heart sounds.  Pulmonary:     Effort: Pulmonary effort is normal.     Breath sounds: Normal breath sounds.  Abdominal:     General: Bowel sounds are normal. There is no distension.     Palpations: Abdomen is soft. There is no mass.  Musculoskeletal:     Cervical back: Neck supple.  Neurological:     Mental Status: She is alert and oriented to person, place, and time.  Skin:    General: Skin is warm and dry.  Psychiatric:        Behavior: Behavior normal.        Thought Content: Thought content normal.        Judgment: Judgment normal.  Vitals reviewed.     Female chaperone present for pelvic and breast  portions of the physical exam  Assessment: 60 y.o. No obstetric history on file. routine annual exam  Plan: Problem List Items Addressed This Visit   None Visit Diagnoses     Health maintenance examination    -  Primary   Encounter for annual routine gynecological examination       Breast cancer screening by mammogram       Relevant Orders   MM DIAG BREAST TOMO BILATERAL   BI-RADS category 3 mammogram result       Relevant Orders   MM DIAG BREAST TOMO BILATERAL       1) Mammogram - recommend yearly mammogram.  Mammogram ordered, diagnostic per radiology.  2) STI screening was offered and declined.  3) Pap smear up to date.   4) Colonoscopy -- Performed in 2019 in [place] per patient. Reports 5 year follow up was recommended.   5)  Routine healthcare maintenance including cholesterol, diabetes screening discussed managed by PCP  6) Osteoporosis screening - no increased risk factors, start at 65.   2020 MD, Adel[facility] Id[facility], [facility] 07/24/2021 10:45 AM

## 2021-07-29 ENCOUNTER — Encounter: Payer: Self-pay | Admitting: Obstetrics and Gynecology

## 2022-01-19 ENCOUNTER — Other Ambulatory Visit: Payer: Self-pay

## 2022-01-19 ENCOUNTER — Ambulatory Visit
Admission: RE | Admit: 2022-01-19 | Discharge: 2022-01-19 | Disposition: A | Discharge status: Home / Self Care | Payer: BC Managed Care – PPO | Source: Ambulatory Visit | Attending: Obstetrics and Gynecology | Admitting: Obstetrics and Gynecology

## 2022-01-19 DIAGNOSIS — R928 Other abnormal and inconclusive findings on diagnostic imaging of breast: Secondary | ICD-10-CM | POA: Diagnosis present

## 2022-01-19 DIAGNOSIS — Z1231 Encounter for screening mammogram for malignant neoplasm of breast: Secondary | ICD-10-CM | POA: Insufficient documentation

## 2022-06-26 ENCOUNTER — Other Ambulatory Visit: Payer: Self-pay | Admitting: Orthopedic Surgery

## 2022-06-26 DIAGNOSIS — M25551 Pain in right hip: Secondary | ICD-10-CM

## 2022-07-03 ENCOUNTER — Ambulatory Visit
Admission: RE | Admit: 2022-07-03 | Discharge: 2022-07-03 | Disposition: A | Discharge status: Home / Self Care | Payer: BC Managed Care – PPO | Source: Ambulatory Visit | Attending: Orthopedic Surgery | Admitting: Orthopedic Surgery

## 2022-07-03 DIAGNOSIS — M25551 Pain in right hip: Secondary | ICD-10-CM | POA: Diagnosis present

## 2022-12-27 NOTE — Progress Notes (Unsigned)
PCP: Leonel Ramsay, MD   No chief complaint on file.   HPI:      Ms. Shelly Kaufman is a 62 y.o. No obstetric history on file. whose LMP was No LMP recorded. Patient is postmenopausal., presents today for her annual examination.  Her menses are {norm/abn:715}, lasting {number: 22536} days.  Dysmenorrhea {dysmen:716}. She {does:18564} have intermenstrual bleeding. She {does:18564} have vasomotor sx.   Sex activity: {sex active: 315163}. She {does:18564} have vaginal dryness.  Last Pap: 05/17/20 Results were: no abnormalities /neg HPV DNA.  Hx of STDs: {STD hx:14358}  Last mammogram: 01/19/22  Results were: normal--routine follow-up in 12 months There is no FH of breast cancer. There is no FH of ovarian cancer. The patient {does:18564} do self-breast exams.  Colonoscopy: 2019 in [place] per pt;  Repeat due after 5 years.   Tobacco use: {tob:20664} Alcohol use: {Alcohol:11675} No drug use Exercise: {exercise:31265}  She {does:18564} get adequate calcium and Vitamin D in her diet.  Labs with PCP.   Patient Active Problem List   Diagnosis Date Noted   BMI 45.0-49.9, adult ([place]) 11/20/2019   Essential hypertension 11/15/2018   History of mitral valve prolapse 11/15/2018   Postmenopausal 11/15/2018    No past surgical history on file.  Family History  Problem Relation Age of Onset   Breast cancer Maternal Aunt    Pancreatic cancer Maternal Aunt 80   Breast cancer Maternal Grandmother     Social History   Socioeconomic History   Marital status: Married    Spouse name: Not on file   Number of children: Not on file   Years of education: Not on file   Highest education level: Not on file  Occupational History   Not on file  Tobacco Use   Smoking status: Never   Smokeless tobacco: Never  Vaping Use   Vaping Use: Never used  Substance and Sexual Activity   Alcohol use: Yes   Drug use: Never   Sexual activity: Not Currently  Other Topics Concern   Not on  file  Social History Narrative   Not on file   Social Determinants of Health   Financial Resource Strain: Not on file  Food Insecurity: Not on file  Transportation Needs: Not on file  Physical Activity: Not on file  Stress: Not on file  Social Connections: Not on file  Intimate Partner Violence: Not on file     Current Outpatient Medications:    Cholecalciferol (VITAMIN D3) 25 MCG (1000 UT) CAPS, Take by mouth., Disp: , Rfl:    hydrochlorothiazide (HYDRODIURIL) 12.5 MG tablet, Take 12.5 mg by mouth every other day., Disp: , Rfl:    lisinopril (PRINIVIL,ZESTRIL) 5 MG tablet, Take by mouth., Disp: , Rfl:    Multiple Vitamin (MULTI-VITAMIN) tablet, Take 1 tablet by mouth daily., Disp: , Rfl:    rosuvastatin (CRESTOR) 20 MG tablet, Take by mouth., Disp: , Rfl:    traZODone (DESYREL) 50 MG tablet, Take 50 mg by mouth at bedtime. (Patient not taking: Reported on 07/23/2021), Disp: , Rfl:      ROS:  Review of Systems BREAST: No symptoms    Objective: There were no vitals taken for this visit.   OBGyn Exam  Results: No results found for this or any previous visit (from the past 24 hour(s)).  Assessment/Plan:  No diagnosis found.   No orders of the defined types were placed in this encounter.           GYN  counsel {counseling: 16159}    F/U  No follow-ups on file.  Diamante Truszkowski B. Kalaysia Demonbreun, PA-C 12/27/2022 5:54 PM

## 2022-12-28 ENCOUNTER — Ambulatory Visit (INDEPENDENT_AMBULATORY_CARE_PROVIDER_SITE_OTHER): Payer: BC Managed Care – PPO | Admitting: Obstetrics and Gynecology

## 2022-12-28 ENCOUNTER — Encounter: Payer: Self-pay | Admitting: Obstetrics and Gynecology

## 2022-12-28 VITALS — BP 137/80 | HR 81 | Ht 68.0 in | Wt 300.0 lb

## 2022-12-28 DIAGNOSIS — Z803 Family history of malignant neoplasm of breast: Secondary | ICD-10-CM

## 2022-12-28 DIAGNOSIS — Z1211 Encounter for screening for malignant neoplasm of colon: Secondary | ICD-10-CM

## 2022-12-28 DIAGNOSIS — Z01419 Encounter for gynecological examination (general) (routine) without abnormal findings: Secondary | ICD-10-CM

## 2022-12-28 DIAGNOSIS — Z8041 Family history of malignant neoplasm of ovary: Secondary | ICD-10-CM | POA: Insufficient documentation

## 2022-12-28 DIAGNOSIS — Z1231 Encounter for screening mammogram for malignant neoplasm of breast: Secondary | ICD-10-CM

## 2022-12-28 NOTE — Patient Instructions (Signed)
I value your feedback and you entrusting us with your care. If you get a [facility] patient survey, I would appreciate you taking the time to let us know about your experience today. Thank you!  [facility]: [phone]      

## 2023-03-12 ENCOUNTER — Other Ambulatory Visit: Payer: Self-pay | Admitting: Internal Medicine

## 2023-03-12 DIAGNOSIS — Z8679 Personal history of other diseases of the circulatory system: Secondary | ICD-10-CM

## 2023-03-12 DIAGNOSIS — Z01818 Encounter for other preprocedural examination: Secondary | ICD-10-CM

## 2023-03-12 DIAGNOSIS — I10 Essential (primary) hypertension: Secondary | ICD-10-CM

## 2023-03-18 ENCOUNTER — Inpatient Hospital Stay: Admission: RE | Admit: 2023-03-18 | Payer: BC Managed Care – PPO | Source: Ambulatory Visit

## 2023-03-23 ENCOUNTER — Ambulatory Visit
Admission: RE | Admit: 2023-03-23 | Discharge: 2023-03-23 | Disposition: A | Discharge status: Home / Self Care | Payer: BC Managed Care – PPO | Source: Ambulatory Visit | Attending: Internal Medicine | Admitting: Internal Medicine

## 2023-03-23 DIAGNOSIS — I10 Essential (primary) hypertension: Secondary | ICD-10-CM

## 2023-03-23 DIAGNOSIS — Z8679 Personal history of other diseases of the circulatory system: Secondary | ICD-10-CM

## 2023-03-23 DIAGNOSIS — Z01818 Encounter for other preprocedural examination: Secondary | ICD-10-CM | POA: Insufficient documentation

## 2023-04-01 ENCOUNTER — Ambulatory Visit
Admission: RE | Admit: 2023-04-01 | Discharge: 2023-04-01 | Disposition: A | Discharge status: Home / Self Care | Payer: BC Managed Care – PPO | Source: Ambulatory Visit | Attending: Obstetrics and Gynecology | Admitting: Obstetrics and Gynecology

## 2023-04-01 DIAGNOSIS — Z1231 Encounter for screening mammogram for malignant neoplasm of breast: Secondary | ICD-10-CM | POA: Diagnosis not present

## 2023-04-27 ENCOUNTER — Encounter
Admission: RE | Disposition: A | Discharge status: Home / Self Care | Payer: Self-pay | Source: Home / Self Care | Attending: Gastroenterology

## 2023-04-27 ENCOUNTER — Ambulatory Visit
Admission: RE | Admit: 2023-04-27 | Discharge: 2023-04-27 | Disposition: A | Discharge status: Home / Self Care | Payer: BC Managed Care – PPO | Attending: Gastroenterology | Admitting: Gastroenterology

## 2023-04-27 ENCOUNTER — Ambulatory Visit: Payer: BC Managed Care – PPO | Admitting: Certified Registered"

## 2023-04-27 ENCOUNTER — Other Ambulatory Visit: Payer: Self-pay

## 2023-04-27 ENCOUNTER — Encounter: Payer: Self-pay | Admitting: *Deleted

## 2023-04-27 DIAGNOSIS — G4730 Sleep apnea, unspecified: Secondary | ICD-10-CM | POA: Diagnosis not present

## 2023-04-27 DIAGNOSIS — K219 Gastro-esophageal reflux disease without esophagitis: Secondary | ICD-10-CM | POA: Diagnosis not present

## 2023-04-27 DIAGNOSIS — D122 Benign neoplasm of ascending colon: Secondary | ICD-10-CM | POA: Diagnosis not present

## 2023-04-27 DIAGNOSIS — K648 Other hemorrhoids: Secondary | ICD-10-CM | POA: Insufficient documentation

## 2023-04-27 DIAGNOSIS — Z1211 Encounter for screening for malignant neoplasm of colon: Secondary | ICD-10-CM | POA: Diagnosis present

## 2023-04-27 DIAGNOSIS — K5730 Diverticulosis of large intestine without perforation or abscess without bleeding: Secondary | ICD-10-CM | POA: Diagnosis not present

## 2023-04-27 DIAGNOSIS — Z86010 Personal history of colonic polyps: Secondary | ICD-10-CM | POA: Insufficient documentation

## 2023-04-27 DIAGNOSIS — Z79899 Other long term (current) drug therapy: Secondary | ICD-10-CM | POA: Diagnosis not present

## 2023-04-27 DIAGNOSIS — Z6841 Body Mass Index (BMI) 40.0 and over, adult: Secondary | ICD-10-CM | POA: Insufficient documentation

## 2023-04-27 DIAGNOSIS — K449 Diaphragmatic hernia without obstruction or gangrene: Secondary | ICD-10-CM | POA: Diagnosis not present

## 2023-04-27 DIAGNOSIS — Z09 Encounter for follow-up examination after completed treatment for conditions other than malignant neoplasm: Secondary | ICD-10-CM | POA: Diagnosis not present

## 2023-04-27 DIAGNOSIS — K640 First degree hemorrhoids: Secondary | ICD-10-CM | POA: Insufficient documentation

## 2023-04-27 DIAGNOSIS — I10 Essential (primary) hypertension: Secondary | ICD-10-CM | POA: Insufficient documentation

## 2023-04-27 HISTORY — PX: POLYPECTOMY: SHX5525

## 2023-04-27 HISTORY — PX: COLONOSCOPY WITH PROPOFOL: SHX5780

## 2023-04-27 HISTORY — DX: Sleep apnea, unspecified: G47.30

## 2023-04-27 HISTORY — PX: ESOPHAGOGASTRODUODENOSCOPY: SHX5428

## 2023-04-27 HISTORY — PX: BIOPSY: SHX5522

## 2023-04-27 SURGERY — COLONOSCOPY WITH PROPOFOL
Anesthesia: General

## 2023-04-27 MED ORDER — PHENYLEPHRINE HCL (PRESSORS) 10 MG/ML IV SOLN
INTRAVENOUS | Status: DC | PRN
  Administered 2023-04-27: 80 ug via INTRAVENOUS

## 2023-04-27 MED ORDER — LIDOCAINE HCL (CARDIAC) PF 100 MG/5ML IV SOSY
PREFILLED_SYRINGE | INTRAVENOUS | Status: DC | PRN
  Administered 2023-04-27: 100 mg via INTRAVENOUS

## 2023-04-27 MED ORDER — PROPOFOL 500 MG/50ML IV EMUL
INTRAVENOUS | Status: DC | PRN
  Administered 2023-04-27: 150 ug/kg/min via INTRAVENOUS

## 2023-04-27 MED ORDER — PROPOFOL 1000 MG/100ML IV EMUL
INTRAVENOUS | Status: AC
  Filled 2023-04-27: qty 100

## 2023-04-27 MED ORDER — PROPOFOL 10 MG/ML IV BOLUS
INTRAVENOUS | Status: DC | PRN
  Administered 2023-04-27: 100 mg via INTRAVENOUS

## 2023-04-27 MED ORDER — DEXMEDETOMIDINE HCL IN NACL 80 MCG/20ML IV SOLN
INTRAVENOUS | Status: DC | PRN
  Administered 2023-04-27: 12 ug via INTRAVENOUS

## 2023-04-27 MED ORDER — SODIUM CHLORIDE 0.9 % IV SOLN: INTRAVENOUS | Status: DC

## 2023-04-27 NOTE — Anesthesia Preprocedure Evaluation (Signed)
Anesthesia Evaluation  Patient identified by MRN, date of birth, ID band Patient awake    Reviewed: Allergy & Precautions, H&P , NPO status , Patient's Chart, lab work & pertinent test results, reviewed documented beta blocker date and time   Airway Mallampati: II   Neck ROM: full    Dental  (+) Poor Dentition   Pulmonary sleep apnea and Continuous Positive Airway Pressure Ventilation    Pulmonary exam normal        Cardiovascular hypertension, On Medications negative cardio ROS Normal cardiovascular exam Rhythm:regular Rate:Normal     Neuro/Psych negative neurological ROS  negative psych ROS   GI/Hepatic negative GI ROS, Neg liver ROS,,,  Endo/Other    Morbid obesity  Renal/GU negative Renal ROS  negative genitourinary   Musculoskeletal   Abdominal   Peds  Hematology negative hematology ROS (+)   Anesthesia Other Findings Past Medical History: No date: Family history of breast cancer     Comment:  9/22 cancer genetic testing letter sent No date: Hypertension No date: Sleep apnea Past Surgical History: No date: CESAREAN SECTION No date: CHOLECYSTECTOMY No date: MYOMECTOMY No date: OTHER SURGICAL HISTORY     Comment:  knee replacement on both knees No date: WISDOM TOOTH EXTRACTION BMI    Body Mass Index: 46.07 kg/m     Reproductive/Obstetrics negative OB ROS                             Anesthesia Physical Anesthesia Plan  ASA: 3  Anesthesia Plan: General   Post-op Pain Management:    Induction:   PONV Risk Score and Plan:   Airway Management Planned:   Additional Equipment:   Intra-op Plan:   Post-operative Plan:   Informed Consent: I have reviewed the patients History and Physical, chart, labs and discussed the procedure including the risks, benefits and alternatives for the proposed anesthesia with the patient or authorized representative who has indicated  his/her understanding and acceptance.     Dental Advisory Given  Plan Discussed with: CRNA  Anesthesia Plan Comments:        Anesthesia Quick Evaluation

## 2023-04-27 NOTE — Transfer of Care (Signed)
Immediate Anesthesia Transfer of Care Note  Patient: BECCI BATTY  Procedure(s) Performed: COLONOSCOPY WITH PROPOFOL ESOPHAGOGASTRODUODENOSCOPY (EGD)  Patient Location: PACU and Endoscopy Unit  Anesthesia Type:General  Level of Consciousness: awake  Airway & Oxygen Therapy: Patient Spontanous Breathing  Post-op Assessment: Report given to RN and Post -op Vital signs reviewed and stable  Post vital signs: Reviewed and stable  Last Vitals:  Vitals Value Taken Time  BP 94/57 04/27/23 0815  Temp 36.2 C 04/27/23 0814  Pulse 74 04/27/23 0816  Resp 20 04/27/23 0816  SpO2 100 % 04/27/23 0816  Vitals shown include unvalidated device data.  Last Pain:  Vitals:   04/27/23 0814  TempSrc: Temporal  PainSc: 0-No pain         Complications: No notable events documented.

## 2023-04-27 NOTE — Interval H&P Note (Signed)
History and Physical Interval Note:  04/27/2023 7:39 AM  Shelly Kaufman  has presented today for surgery, with the diagnosis of PH Colon Polyps,gerd.  The various methods of treatment have been discussed with the patient and family. After consideration of risks, benefits and other options for treatment, the patient has consented to  Procedure(s): COLONOSCOPY WITH PROPOFOL (N/A) ESOPHAGOGASTRODUODENOSCOPY (EGD) (N/A) as a surgical intervention.  The patient's history has been reviewed, patient examined, no change in status, stable for surgery.  I have reviewed the patient's chart and labs.  Questions were answered to the patient's satisfaction.     Regis Bill  Ok to proceed with EGD/Colonoscopy

## 2023-04-27 NOTE — H&P (Signed)
Outpatient short stay form Pre-procedure 04/27/2023  Shelly Bill, MD  Primary Physician: Mick Sell, MD  Reason for visit:  GERD/Surveillance  History of present illness:    62 y/o lady with history of hypertension and obesity here for EGD/Colonoscopy for GERD and history of polyps on colonoscopy done in 2019 done in [place]. No blood thinners. No family history of GI malignancies. No significant abdominal surgeries.    Current Facility-Administered Medications:    0.9 %  sodium chloride infusion, , Intravenous, Continuous, Latarsha Zani, Rossie Muskrat, MD, Last Rate: 20 mL/hr at 04/27/23 0715, Continued from Pre-op at 04/27/23 0715  Medications Prior to Admission  Medication Sig Dispense Refill Last Dose   Cholecalciferol (VITAMIN D3) 25 MCG (1000 UT) CAPS Take by mouth.   Past Week   hydrochlorothiazide (HYDRODIURIL) 12.5 MG tablet Take 12.5 mg by mouth every other day.   04/26/2023   lisinopril (PRINIVIL,ZESTRIL) 5 MG tablet Take by mouth.   04/27/2023 at 0615   Multiple Vitamin (MULTI-VITAMIN) tablet Take 1 tablet by mouth daily.   Past Week   SUMAtriptan (IMITREX) 50 MG tablet TAKE 1 TAB BY MOUTH AS DIRECTED FOR MIGRAINE MAY TAKE A SECOND DOSE AFTER 2 HOURS IF NEEDED.   Past Month   ibuprofen (ADVIL) 200 MG tablet Take by mouth.      rosuvastatin (CRESTOR) 20 MG tablet Take by mouth.        No Known Allergies   Past Medical History:  Diagnosis Date   Family history of breast cancer    9/22 cancer genetic testing letter sent   Hypertension    Sleep apnea     Review of systems:  Otherwise negative.    Physical Exam  Gen: Alert, oriented. Appears stated age.  HEENT: PERRLA. Lungs: No respiratory distress CV: RRR Abd: soft, benign, no masses Ext: No edema    Planned procedures: Proceed with EGD/colonoscopy. The patient understands the nature of the planned procedure, indications, risks, alternatives and potential complications including but not limited to  bleeding, infection, perforation, damage to internal organs and possible oversedation/side effects from anesthesia. The patient agrees and gives consent to proceed.  Please refer to procedure notes for findings, recommendations and patient disposition/instructions.     Shelly Bill, MD [facility]

## 2023-04-27 NOTE — Anesthesia Procedure Notes (Addendum)
Procedure Name: MAC Date/Time: 04/27/2023 7:42 AM  Performed by: Cheral Bay, CRNAPre-anesthesia Checklist: Patient identified, Emergency Drugs available, Suction available, Patient being monitored and Timeout performed Patient Re-evaluated:Patient Re-evaluated prior to induction Oxygen Delivery Method: Simple face mask Induction Type: IV induction Placement Confirmation: positive ETCO2 and CO2 detector

## 2023-04-27 NOTE — Anesthesia Postprocedure Evaluation (Signed)
Anesthesia Post Note  Patient: Shelly Kaufman  Procedure(s) Performed: COLONOSCOPY WITH PROPOFOL ESOPHAGOGASTRODUODENOSCOPY (EGD)  Patient location during evaluation: PACU Anesthesia Type: General Level of consciousness: awake and alert Pain management: pain level controlled Vital Signs Assessment: post-procedure vital signs reviewed and stable Respiratory status: spontaneous breathing, nonlabored ventilation, respiratory function stable and patient connected to nasal cannula oxygen Cardiovascular status: blood pressure returned to baseline and stable Postop Assessment: no apparent nausea or vomiting Anesthetic complications: no   No notable events documented.   Last Vitals:  Vitals:   04/27/23 0824 04/27/23 0834  BP: 96/66 107/79  Pulse:    Resp:    Temp:    SpO2:      Last Pain:  Vitals:   04/27/23 0834  TempSrc:   PainSc: 0-No pain                 Yevette Edwards

## 2023-04-27 NOTE — Op Note (Signed)
[facility] Patient Name: Shelly Kaufman Procedure Date: 04/27/2023 7:05 AM MRN: [id number] Account #: [id number] Date of Birth: May 03, 1961 Admit Type: Outpatient Age: 62 Room: [facility] ENDO ROOM 1 Gender: Female Note Status: Finalized Instrument Name: Prentice Docker [id number] Procedure:             Colonoscopy Indications:           Surveillance: Personal history of adenomatous polyps                         on last colonoscopy 5 years ago Providers:             Eather Colas MD, MD Referring MD:          Clydie Braun (Referring MD) Medicines:             Monitored Anesthesia Care Complications:         No immediate complications. Estimated blood loss:                         Minimal. Procedure:             Pre-Anesthesia Assessment:                        - Prior to the procedure, a History and Physical was                         performed, and patient medications and allergies were                         reviewed. The patient is competent. The risks and                         benefits of the procedure and the sedation options and                         risks were discussed with the patient. All questions                         were answered and informed consent was obtained.                         Patient identification and proposed procedure were                         verified by the physician, the nurse, the                         anesthesiologist, the anesthetist and the technician                         in the endoscopy suite. Mental Status Examination:                         alert and oriented. Airway Examination: normal                         oropharyngeal airway and neck mobility. Respiratory  Examination: clear to auscultation. CV Examination:                         normal. Prophylactic Antibiotics: The patient does not                         require prophylactic antibiotics. Prior                          Anticoagulants: The patient has taken no anticoagulant                         or antiplatelet agents. ASA Grade Assessment: III - A                         patient with severe systemic disease. After reviewing                         the risks and benefits, the patient was deemed in                         satisfactory condition to undergo the procedure. The                         anesthesia plan was to use monitored anesthesia care                         (MAC). Immediately prior to administration of                         medications, the patient was re-assessed for adequacy                         to receive sedatives. The heart rate, respiratory                         rate, oxygen saturations, blood pressure, adequacy of                         pulmonary ventilation, and response to care were                         monitored throughout the procedure. The physical                         status of the patient was re-assessed after the                         procedure.                        After obtaining informed consent, the colonoscope was                         passed under direct vision. Throughout the procedure,                         the patient's blood pressure, pulse, and oxygen  saturations were monitored continuously. The                         Colonoscope was introduced through the anus and                         advanced to the the cecum, identified by appendiceal                         orifice and ileocecal valve. The colonoscopy was                         somewhat difficult due to significant looping.                         Successful completion of the procedure was aided by                         applying abdominal pressure. The patient tolerated the                         procedure well. The quality of the bowel preparation                         was good. The ileocecal valve, appendiceal orifice,                         and  rectum were photographed. Findings:      The perianal and digital rectal examinations were normal.      A 4 mm polyp was found in the ascending colon. The polyp was sessile.       The polyp was removed with a cold snare. Resection and retrieval were       complete. Estimated blood loss was minimal.      A few small-mouthed diverticula were found in the ascending colon.      Internal hemorrhoids were found during retroflexion. The hemorrhoids       were Grade I (internal hemorrhoids that do not prolapse).      The exam was otherwise without abnormality on direct and retroflexion       views. Impression:            - One 4 mm polyp in the ascending colon, removed with                         a cold snare. Resected and retrieved.                        - Diverticulosis in the ascending colon.                        - Internal hemorrhoids.                        - The examination was otherwise normal on direct and                         retroflexion views. Recommendation:        - Discharge patient to home.                        -  Resume previous diet.                        - Continue present medications.                        - Await pathology results.                        - Repeat colonoscopy in 7 years for surveillance.                        - Return to referring physician as previously                         scheduled. Procedure Code(s):     --- Professional ---                        [phone], Colonoscopy, flexible; with removal of                         tumor(s), polyp(s), or other lesion(s) by snare                         technique Diagnosis Code(s):     --- Professional ---                        Z86.010, Personal history of colonic polyps                        D12.2, Benign neoplasm of ascending colon                        K64.0, First degree hemorrhoids                        K57.30, Diverticulosis of large intestine without                         perforation or  abscess without bleeding CPT copyright 2022 [employer] Association. All rights reserved. The codes documented in this report are preliminary and upon coder review may  be revised to meet current compliance requirements. Eather Colas MD, MD 04/27/2023 8:17:38 AM Number of Addenda: 0 Note Initiated On: 04/27/2023 7:05 AM Scope Withdrawal Time: 0 hours 11 minutes 24 seconds  Total Procedure Duration: 0 hours 16 minutes 56 seconds  Estimated Blood Loss:  Estimated blood loss was minimal.      [facility]

## 2023-04-27 NOTE — Op Note (Signed)
[facility] Gastroenterology Patient Name: Shelly Kaufman Procedure Date: 04/27/2023 7:16 AM MRN: [id number] Account #: [id number] Date of Birth: 1961/04/09 Admit Type: Outpatient Age: 62 Room: [facility] ENDO ROOM 1 Gender: Female Note Status: Finalized Instrument Name: Upper Endoscope [id number] Procedure:             Upper GI endoscopy Indications:           Gastro-esophageal reflux disease Providers:             Eather Colas MD, MD Referring MD:          Clydie Braun (Referring MD) Medicines:             Monitored Anesthesia Care Complications:         No immediate complications. Estimated blood loss:                         Minimal. Procedure:             Pre-Anesthesia Assessment:                        - Prior to the procedure, a History and Physical was                         performed, and patient medications and allergies were                         reviewed. The patient is competent. The risks and                         benefits of the procedure and the sedation options and                         risks were discussed with the patient. All questions                         were answered and informed consent was obtained.                         Patient identification and proposed procedure were                         verified by the physician, the nurse, the                         anesthesiologist, the anesthetist and the technician                         in the endoscopy suite. Mental Status Examination:                         alert and oriented. Airway Examination: normal                         oropharyngeal airway and neck mobility. Respiratory                         Examination: clear to auscultation. CV Examination:  normal. Prophylactic Antibiotics: The patient does not                         require prophylactic antibiotics. Prior                         Anticoagulants: The patient has taken no anticoagulant                          or antiplatelet agents. ASA Grade Assessment: III - A                         patient with severe systemic disease. After reviewing                         the risks and benefits, the patient was deemed in                         satisfactory condition to undergo the procedure. The                         anesthesia plan was to use monitored anesthesia care                         (MAC). Immediately prior to administration of                         medications, the patient was re-assessed for adequacy                         to receive sedatives. The heart rate, respiratory                         rate, oxygen saturations, blood pressure, adequacy of                         pulmonary ventilation, and response to care were                         monitored throughout the procedure. The physical                         status of the patient was re-assessed after the                         procedure.                        After obtaining informed consent, the endoscope was                         passed under direct vision. Throughout the procedure,                         the patient's blood pressure, pulse, and oxygen                         saturations were monitored continuously. The Endoscope  was introduced through the mouth, and advanced to the                         second part of duodenum. The upper GI endoscopy was                         accomplished without difficulty. The patient tolerated                         the procedure well. Findings:      A small hiatal hernia was present.      The exam of the esophagus was otherwise normal.      The entire examined stomach was normal. Biopsies were taken with a cold       forceps for Helicobacter pylori testing. Estimated blood loss was       minimal.      The examined duodenum was normal. Impression:            - Small hiatal hernia.                        - Normal stomach. Biopsied.                         - Normal examined duodenum. Recommendation:        - Discharge patient to home.                        - Resume previous diet.                        - Continue present medications.                        - Await pathology results.                        - Return to referring physician as previously                         scheduled. Procedure Code(s):     --- Professional ---                        [phone], Esophagogastroduodenoscopy, flexible,                         transoral; with biopsy, single or multiple Diagnosis Code(s):     --- Professional ---                        K44.9, Diaphragmatic hernia without obstruction or                         gangrene                        K21.9, Gastro-esophageal reflux disease without                         esophagitis CPT copyright 2022 [employer]. All rights reserved. The codes documented in this report are preliminary and upon coder review may  be revised to  meet current compliance requirements. Eather Colas MD, MD 04/27/2023 8:15:03 AM Number of Addenda: 0 Note Initiated On: 04/27/2023 7:16 AM Estimated Blood Loss:  Estimated blood loss was minimal.      [facility]

## 2023-04-28 ENCOUNTER — Encounter: Payer: Self-pay | Admitting: Gastroenterology

## 2024-06-06 ENCOUNTER — Other Ambulatory Visit: Payer: Self-pay | Admitting: Infectious Diseases

## 2024-06-06 DIAGNOSIS — Z1231 Encounter for screening mammogram for malignant neoplasm of breast: Secondary | ICD-10-CM

## 2024-06-28 ENCOUNTER — Ambulatory Visit
Admission: RE | Admit: 2024-06-28 | Discharge: 2024-06-28 | Disposition: A | Discharge status: Home / Self Care | Payer: PRIVATE HEALTH INSURANCE | Source: Ambulatory Visit | Attending: Infectious Diseases | Admitting: Infectious Diseases

## 2024-06-28 DIAGNOSIS — Z1231 Encounter for screening mammogram for malignant neoplasm of breast: Secondary | ICD-10-CM | POA: Diagnosis present

## 2024-09-25 ENCOUNTER — Other Ambulatory Visit: Payer: Self-pay | Admitting: Infectious Diseases

## 2024-09-25 DIAGNOSIS — R1011 Right upper quadrant pain: Secondary | ICD-10-CM

## 2024-09-25 DIAGNOSIS — R10A1 Flank pain, right side: Secondary | ICD-10-CM

## 2024-10-03 ENCOUNTER — Ambulatory Visit
Admission: RE | Admit: 2024-10-03 | Discharge: 2024-10-03 | Disposition: A | Payer: PRIVATE HEALTH INSURANCE | Source: Ambulatory Visit | Attending: Infectious Diseases | Admitting: Infectious Diseases

## 2024-10-03 DIAGNOSIS — R1011 Right upper quadrant pain: Secondary | ICD-10-CM | POA: Insufficient documentation

## 2024-10-03 DIAGNOSIS — R10A1 Flank pain, right side: Secondary | ICD-10-CM | POA: Diagnosis present
